# Patient Record
Sex: Female | Born: 1940 | Race: White | Hispanic: No | Marital: Married | Smoking: Former smoker
Health system: Southern US, Community
[De-identification: ages and names within clinical notes are randomized; demographics above are authoritative.]

## PROBLEM LIST (undated history)

## (undated) DIAGNOSIS — I480 Paroxysmal atrial fibrillation: Secondary | ICD-10-CM

## (undated) DIAGNOSIS — A419 Sepsis, unspecified organism: Secondary | ICD-10-CM

## (undated) DIAGNOSIS — E876 Hypokalemia: Secondary | ICD-10-CM

## (undated) DIAGNOSIS — R112 Nausea with vomiting, unspecified: Secondary | ICD-10-CM

## (undated) DIAGNOSIS — Z5189 Encounter for other specified aftercare: Secondary | ICD-10-CM

## (undated) DIAGNOSIS — K8051 Calculus of bile duct without cholangitis or cholecystitis with obstruction: Secondary | ICD-10-CM

## (undated) DIAGNOSIS — T8859XA Other complications of anesthesia, initial encounter: Secondary | ICD-10-CM

## (undated) DIAGNOSIS — E785 Hyperlipidemia, unspecified: Secondary | ICD-10-CM

## (undated) DIAGNOSIS — T7840XA Allergy, unspecified, initial encounter: Secondary | ICD-10-CM

## (undated) DIAGNOSIS — R7303 Prediabetes: Secondary | ICD-10-CM

## (undated) DIAGNOSIS — N390 Urinary tract infection, site not specified: Secondary | ICD-10-CM

## (undated) DIAGNOSIS — K219 Gastro-esophageal reflux disease without esophagitis: Secondary | ICD-10-CM

## (undated) DIAGNOSIS — M1990 Unspecified osteoarthritis, unspecified site: Secondary | ICD-10-CM

## (undated) DIAGNOSIS — F1910 Other psychoactive substance abuse, uncomplicated: Secondary | ICD-10-CM

## (undated) DIAGNOSIS — H269 Unspecified cataract: Secondary | ICD-10-CM

## (undated) DIAGNOSIS — F419 Anxiety disorder, unspecified: Secondary | ICD-10-CM

## (undated) DIAGNOSIS — I499 Cardiac arrhythmia, unspecified: Secondary | ICD-10-CM

## (undated) DIAGNOSIS — K859 Acute pancreatitis without necrosis or infection, unspecified: Secondary | ICD-10-CM

## (undated) DIAGNOSIS — I10 Essential (primary) hypertension: Secondary | ICD-10-CM

## (undated) HISTORY — PX: APPENDECTOMY: SHX54

## (undated) HISTORY — DX: Encounter for other specified aftercare: Z51.89

## (undated) HISTORY — PX: ABDOMINAL HYSTERECTOMY: SHX81

## (undated) HISTORY — DX: Unspecified osteoarthritis, unspecified site: M19.90

## (undated) HISTORY — PX: EXPLORATORY LAPAROTOMY: SUR591

## (undated) HISTORY — DX: Allergy, unspecified, initial encounter: T78.40XA

## (undated) HISTORY — DX: Unspecified cataract: H26.9

## (undated) HISTORY — DX: Prediabetes: R73.03

## (undated) HISTORY — DX: Nausea with vomiting, unspecified: R11.2

## (undated) HISTORY — DX: Sepsis, unspecified organism: A41.9

## (undated) HISTORY — DX: Hypokalemia: E87.6

## (undated) HISTORY — DX: Hyperlipidemia, unspecified: E78.5

## (undated) HISTORY — PX: PARTIAL HYSTERECTOMY: SHX80

## (undated) HISTORY — DX: Anxiety disorder, unspecified: F41.9

## (undated) HISTORY — PX: JOINT REPLACEMENT: SHX530

## (undated) HISTORY — DX: Other psychoactive substance abuse, uncomplicated: F19.10

## (undated) HISTORY — DX: Essential (primary) hypertension: I10

## (undated) HISTORY — DX: Urinary tract infection, site not specified: N39.0

## (undated) HISTORY — DX: Calculus of bile duct without cholangitis or cholecystitis with obstruction: K80.51

---

## 2005-12-04 ENCOUNTER — Ambulatory Visit: Payer: Self-pay | Admitting: Infectious Diseases

## 2007-03-17 ENCOUNTER — Ambulatory Visit: Payer: Self-pay | Admitting: Specialist

## 2007-04-07 ENCOUNTER — Ambulatory Visit: Payer: Self-pay | Admitting: Orthopedic Surgery

## 2007-04-12 ENCOUNTER — Ambulatory Visit: Payer: Self-pay | Admitting: Orthopedic Surgery

## 2007-12-18 IMAGING — NM NUCLEAR MEDICINE WHOLE BODY BONE SCINTIGRAPHY
2 series · 9 of 9 positions shown · non-contrast
Comparison: none

REASON FOR EXAM: Righ Thigh  pain
COMMENTS:

[Series 1: statics · 2.40mm/px · 4 acquisitions, 7 frames shown]
[im 1/4]
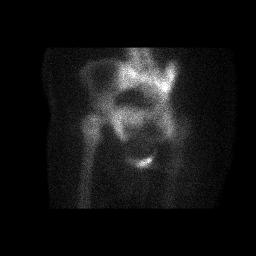
[im 2/4]
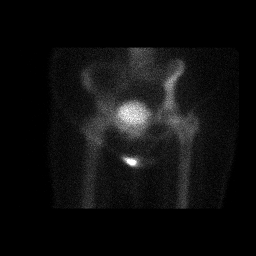
[im 2/4]
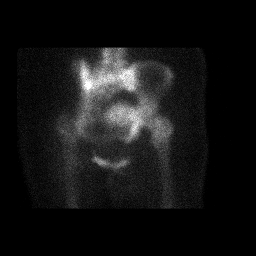
[im 3/4]
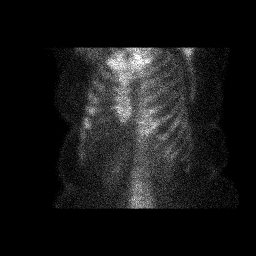
[im 3/4]
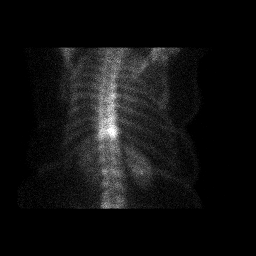
[im 4/4]
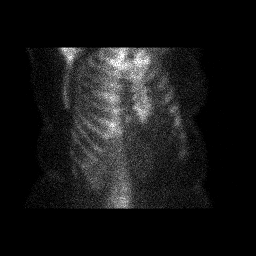
[im 4/4]
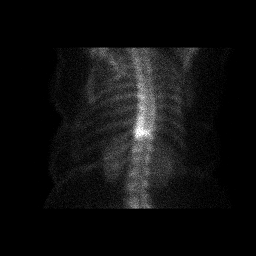

[Series 1: 3 hr wholebody · 2.40mm/px · 2 of 2 frames shown]
[frame 1/2]
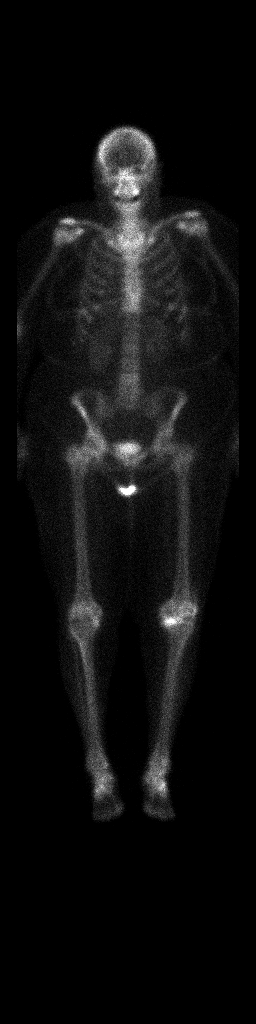
[frame 2/2]
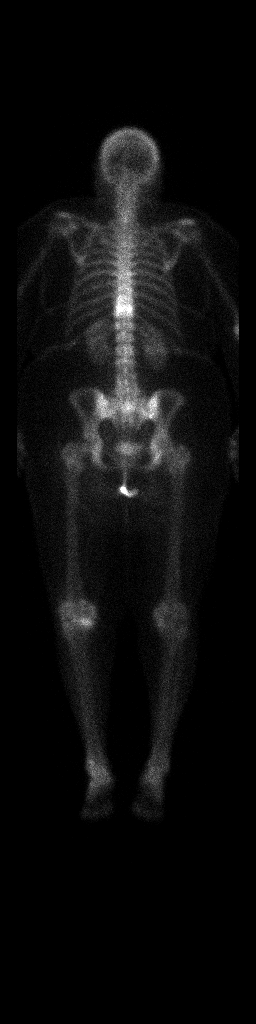

[9 of 9 positions shown; findings below may reference images not displayed]

PROCEDURE:     NM  - NM BONE WB 3 HR [DATE]  [DATE]

RESULT:     The patient received an injection of 22.78 mCi of Tc 99m labeled
MDP for the study. Anterior and posterior whole-body images were obtained in
addition to oblique views over the pelvis and thoracic region. There is
increased localization medially in the LEFT knee which is fairly impressive.
There is also some minimal increased activity in the medial aspect of the
RIGHT knee as well as in the thoracic spine at the T11 level. There is
slightly asymmetric increased localization within the RIGHT sacroiliac joint
on the posterior view. No definite increased localization is seen in either
femur. There is increased localization diffusely in both feet and ankle
regions as well as in the shoulders.
IMPRESSION: 1. Abnormal whole-body bone scan. The uptake in the medial knee compartments
may be secondary to degenerative change. An occult tibial plateau fracture
on the LEFT could not be completely excluded. Plain film correlation is
recommended. There is also localization in the thoracic spine which may be
secondary to a compression fracture.
2. Probable degenerative changes in the shoulders and ankles as well as in
the sternoclavicular joints.

## 2008-02-20 ENCOUNTER — Ambulatory Visit: Payer: Self-pay | Admitting: Orthopedic Surgery

## 2008-03-14 ENCOUNTER — Inpatient Hospital Stay: Payer: Self-pay | Admitting: Orthopedic Surgery

## 2009-07-08 ENCOUNTER — Ambulatory Visit: Payer: Self-pay | Admitting: Orthopedic Surgery

## 2009-07-17 ENCOUNTER — Inpatient Hospital Stay: Payer: Self-pay | Admitting: Orthopedic Surgery

## 2010-11-09 HISTORY — PX: PARATHYROID EXPLORATION: SHX732

## 2011-05-27 ENCOUNTER — Ambulatory Visit: Payer: Self-pay | Admitting: Otolaryngology

## 2011-06-10 ENCOUNTER — Ambulatory Visit: Payer: Self-pay | Admitting: Otolaryngology

## 2011-06-10 HISTORY — PX: PARATHYROIDECTOMY: SHX19

## 2011-06-11 LAB — PATHOLOGY REPORT

## 2014-09-28 ENCOUNTER — Ambulatory Visit: Payer: Self-pay | Admitting: Endocrinology

## 2015-07-04 ENCOUNTER — Encounter: Payer: Self-pay | Admitting: *Deleted

## 2015-07-11 ENCOUNTER — Ambulatory Visit (INDEPENDENT_AMBULATORY_CARE_PROVIDER_SITE_OTHER): Payer: Medicare HMO | Admitting: General Surgery

## 2015-07-11 ENCOUNTER — Encounter: Payer: Self-pay | Admitting: General Surgery

## 2015-07-11 VITALS — BP 142/82 | HR 82 | Resp 16 | Ht 62.0 in | Wt 207.0 lb

## 2015-07-11 DIAGNOSIS — K8064 Calculus of gallbladder and bile duct with chronic cholecystitis without obstruction: Secondary | ICD-10-CM | POA: Diagnosis not present

## 2015-07-11 NOTE — Patient Instructions (Addendum)
The patient is aware to call back for any questions or concerns.  Laparoscopic Cholecystectomy Laparoscopic cholecystectomy is surgery to remove the gallbladder. The gallbladder is located in the upper right part of the abdomen, behind the liver. It is a storage sac for bile produced in the liver. Bile aids in the digestion and absorption of fats. Cholecystectomy is often done for inflammation of the gallbladder (cholecystitis). This condition is usually caused by a buildup of gallstones (cholelithiasis) in your gallbladder. Gallstones can block the flow of bile, resulting in inflammation and pain. In severe cases, emergency surgery may be required. When emergency surgery is not required, you will have time to prepare for the procedure. Laparoscopic surgery is an alternative to open surgery. Laparoscopic surgery has a shorter recovery time. Your common bile duct may also need to be examined during the procedure. If stones are found in the common bile duct, they may be removed. LET [facility] CARE PROVIDER KNOW ABOUT:  Any allergies you have.  All medicines you are taking, including vitamins, herbs, eye drops, creams, and over-the-counter medicines.  Previous problems you or members of your family have had with the use of anesthetics.  Any blood disorders you have.  Previous surgeries you have had.  Medical conditions you have. RISKS AND COMPLICATIONS Generally, this is a safe procedure. However, as with any procedure, complications can occur. Possible complications include:  Infection.  Damage to the common bile duct, nerves, arteries, veins, or other internal organs such as the stomach, liver, or intestines.  Bleeding.  A stone may remain in the common bile duct.  A bile leak from the cyst duct that is clipped when your gallbladder is removed.  The need to convert to open surgery, which requires a larger incision in the abdomen. This may be necessary if your surgeon thinks it is not  safe to continue with a laparoscopic procedure. BEFORE THE PROCEDURE  Ask your health care provider about changing or stopping any regular medicines. You will need to stop taking aspirin or blood thinners at least 5 days prior to surgery.  Do not eat or drink anything after midnight the night before surgery.  Let your health care provider know if you develop a cold or other infectious problem before surgery. PROCEDURE   You will be given medicine to make you sleep through the procedure (general anesthetic). A breathing tube will be placed in your mouth.  When you are asleep, your surgeon will make several small cuts (incisions) in your abdomen.  A thin, lighted tube with a tiny camera on the end (laparoscope) is inserted through one of the small incisions. The camera on the laparoscope sends a picture to a TV screen in the operating room. This gives the surgeon a good view inside your abdomen.  A gas will be pumped into your abdomen. This expands your abdomen so that the surgeon has more room to perform the surgery.  Other tools needed for the procedure are inserted through the other incisions. The gallbladder is removed through one of the incisions.  After the removal of your gallbladder, the incisions will be closed with stitches, staples, or skin glue. AFTER THE PROCEDURE  You will be taken to a recovery area where your progress will be checked often.  You may be allowed to go home the same day if your pain is controlled and you can tolerate liquids. Document Released: 10/26/2005 Document Revised: 08/16/2013 Document Reviewed: 06/07/2013 [employer] Patient Information 2015 [place], [place]. This information is not  intended to replace advice given to you by your health care provider. Make sure you discuss any questions you have with your health care provider.

## 2015-07-11 NOTE — Progress Notes (Signed)
Patient ID: Charlene Norris, female   DOB: 02/28/1941, 74 y.o.   MRN: [id number]  Chief Complaint  Patient presents with  . Abdominal Pain    HPI Charlene Norris is a 74 y.o. female.  Here today for evaluation of her gallbladder. She states she several years she has had nausea, no vomiting. But over the past several months it seems to be worse with occasional right abdominal pain and upper stomach. The pain comes and goes, feels like a "knife". Poor appetite. Can't tolerate greasy and fatty foods. CT scan was 07-03-15. She states she just "feels bad". She has als been short of breath with minimal activity for last4-6 weeks.She has not mentioned this to Dr. Patrecia Pace.  HPI  Past Medical History  Diagnosis Date  . Cataract   . Arthritis   . Hypertension   . Hyperlipidemia   . Pre-diabetes     Past Surgical History  Procedure Laterality Date  . Partial hysterectomy    . Joint replacement Bilateral 2009, 2010    knee  . Parathyroid exploration  2012    growth/ Dr Willeen Cass  . Exploratory laparotomy      Family History  Problem Relation Age of Onset  . Diabetes Father   . Hypertension Father   . Parkinson's disease Mother     Social History Social History  Substance Use Topics  . Smoking status: Former Smoker -- 5 years    Quit date: 11/09/1968  . Smokeless tobacco: Never Used  . Alcohol Use: No    Allergies  Allergen Reactions  . Allopurinol Shortness Of Breath  . Levofloxacin Shortness Of Breath    Chest pressure  . Ampicillin Itching  . Celebrex [Celecoxib] Hives  . Nexium [Esomeprazole Magnesium] Hives  . Zyrtec [Cetirizine] Other (See Comments)    Feel ill    Current Outpatient Prescriptions  Medication Sig Dispense Refill  . amoxicillin (AMOXIL) 500 MG capsule Take 500 mg by mouth as needed. procedures    . aspirin 325 MG tablet Take 325 mg by mouth daily.    . clidinium-chlordiazePOXIDE (LIBRAX) 5-2.5 MG per capsule TK 1 C PO QD PRN  1  .  glucosamine-chondroitin 500-400 MG tablet Take 1 tablet by mouth 2 (two) times daily.    Marland Kitchen lisinopril (PRINIVIL,ZESTRIL) 10 MG tablet TK 1 T PO D  2  . lovastatin (MEVACOR) 40 MG tablet TK 1 T PO QAM  2  . Omega-3 Fatty Acids (FISH OIL) 1200 MG CAPS Take by mouth daily.    . propranolol-hydrochlorothiazide (INDERIDE) 40-25 MG per tablet TK 1 T PO QAM  2  . ranitidine (ZANTAC) 150 MG tablet TK 1 T PO BID  0  . ULORIC 40 MG tablet TK 1 T PO ONCE D  2  . vitamin B-12 (CYANOCOBALAMIN) 1000 MCG tablet Take 1,000 mcg by mouth daily.    . Vitamin D, Ergocalciferol, (DRISDOL) 50000 UNITS CAPS capsule TK 1 C PO TWO TIMES A WEEK  2  . vitamin E 400 UNIT capsule Take 400 Units by mouth daily.     No current facility-administered medications for this visit.    Review of Systems Review of Systems  Constitutional: Positive for fatigue.  Respiratory: Positive for shortness of breath.   Cardiovascular: Negative.   Gastrointestinal: Positive for nausea and abdominal pain. Negative for vomiting, diarrhea and constipation.    Blood pressure 142/82, pulse 82, resp. rate 16, height  (1.575 m), weight 207 lb (93.895 kg).  Physical Exam  Physical Exam  Constitutional: She is oriented to person, place, and time. She appears well-developed and well-nourished.  HENT:  Mouth/Throat: Oropharynx is clear and moist.  Eyes: Conjunctivae are normal. No scleral icterus.  Neck: Neck supple.  Cardiovascular: Normal rate, regular rhythm and normal heart sounds.   Pulmonary/Chest: Effort normal and breath sounds normal.  Abdominal: Soft. Normal appearance and bowel sounds are normal. There is tenderness in the right upper quadrant.  Mild tenderness RUQ.  Lymphadenopathy:    She has no cervical adenopathy.  Neurological: She is alert and oriented to person, place, and time.  Skin: Skin is warm and dry.  Psychiatric: Her behavior is normal.    Data Reviewed Progress notes and recent CT scan, [place]. gallstones  and thickened GBW  Assessment    Cholelithiasis with chronic cholecystitis.    Plan   Recommended laparoscopic cholecystectomy. Laparoscopic Cholecystectomy with Intraoperative Cholangiogram. The procedure, including it's potential risks and complications (including but not limited to infection, bleeding, injury to intra-abdominal organs or bile ducts, bile leak, poor cosmetic result, sepsis and death) were discussed with the patient in detail. Non-operative options, including their inherent risks (acute calculous cholecystitis with possible choledocholithiasis or gallstone pancreatitis, with the risk of ascending cholangitis, sepsis, and death) were discussed as well. The patient expressed and understanding of what we discussed and wishes to proceed with laparoscopic cholecystectomy. The patient further understands that if it is technically not possible, or it is unsafe to proceed laparoscopically, that I will convert to an open cholecystectomy. Will need shortness of breath evaluation by Dr. Patrecia Pace prior to surgery. Patient has been scheduled for an appointment on 07-12-15 at 10 am.     Patient's surgery to be arranged once clearance is received from Dr. Patrecia Pace.      PCP:  Mina Marble 07/11/2015, 10:11 AM

## 2015-07-17 ENCOUNTER — Telehealth: Payer: Self-pay | Admitting: *Deleted

## 2015-07-17 NOTE — Telephone Encounter (Signed)
Patient wanted to know if you have received and review the medical clearance from Dr. Randell Loop office, so she can planned on getting her surgery scheduled.

## 2015-07-18 ENCOUNTER — Other Ambulatory Visit: Payer: Self-pay | Admitting: General Surgery

## 2015-07-18 DIAGNOSIS — K8010 Calculus of gallbladder with chronic cholecystitis without obstruction: Secondary | ICD-10-CM

## 2015-07-18 NOTE — Telephone Encounter (Signed)
We have received surgical clearance from Dr. Gregery Na office.   Patient's surgery has been scheduled for 07-24-15 at [facility]. This patient has been asked to decrease current 325 mg aspirin to 81 mg aspirin starting today.

## 2015-07-19 ENCOUNTER — Other Ambulatory Visit: Payer: Self-pay

## 2015-07-19 ENCOUNTER — Encounter: Payer: Self-pay | Admitting: *Deleted

## 2015-07-19 NOTE — Patient Instructions (Signed)
  Your procedure is scheduled on: 07-24-15 Report to [facility] SAME [facility] 2ND FLOOR To find out your arrival time please call [phone] between 1PM - 3PM on 07-23-15  Remember: Instructions that are not followed completely may result in serious medical risk, up to and including death, or upon the discretion of your surgeon and anesthesiologist your surgery may need to be rescheduled.    _X___ 1. Do not eat food or drink liquids after midnight. No gum chewing or hard candies.     _X___ 2. No Alcohol for 24 hours before or after surgery.   ____ 3. Bring all medications with you on the day of surgery if instructed.    ____ 4. Notify your doctor if there is any change in your medical condition     (cold, fever, infections).     Do not wear jewelry, make-up, hairpins, clips or nail polish.  Do not wear lotions, powders, or perfumes. You may wear deodorant.  Do not shave 48 hours prior to surgery. Men may shave face and neck.  Do not bring valuables to the hospital.    [facility] is not responsible for any belongings or valuables.               Contacts, dentures or bridgework may not be worn into surgery.  Leave your suitcase in the car. After surgery it may be brought to your room.  For patients admitted to the hospital, discharge time is determined by your   treatment team.   Patients discharged the day of surgery will not be allowed to drive home.   Please read over the following fact sheets that you were given:     __X__ Take these medicines the morning of surgery with A SIP OF WATER:    1. PEPCID  2. TAKE A PEPCID Tuesday NIGHT  3.   4.  5.  6.  ____ Fleet Enema (as directed)   ____ Use CHG Soap as directed  ____ Use inhalers on the day of surgery  ____ Stop metformin 2 days prior to surgery    ____ Take 1/2 of usual insulin dose the night before surgery and none on the morning of surgery.   ____ Stop Coumadin/Plavix/aspirin-PT CAN CONTINUE 81 MG ASA PER  PT PER DR SANKAR-DO NOT TAKE DAY OF SURGERY  ____ Stop Anti-inflammatories-NO NSAIDS OR ASA PRODUCTS-TYLENOL OK   __X__ Stop supplements until after surgery-STOP VIT E, FISH OIL AND GLUCOSAMINE-CHONDROITIN NOW  ____ Bring C-Pap to the hospital.

## 2015-07-23 MED ORDER — NA CHONDROIT SULF-NA HYALURON 40-17 MG/ML IO SOLN
INTRAOCULAR | Status: AC
  Filled 2015-07-23: qty 1

## 2015-07-23 MED ORDER — CEFUROXIME OPHTHALMIC INJECTION 1 MG/0.1 ML
INJECTION | OPHTHALMIC | Status: AC
  Filled 2015-07-23: qty 0.1

## 2015-07-23 MED ORDER — EPINEPHRINE HCL 1 MG/ML IJ SOLN
INTRAMUSCULAR | Status: AC
  Filled 2015-07-23: qty 1

## 2015-07-24 ENCOUNTER — Ambulatory Visit: Payer: Medicare HMO

## 2015-07-24 ENCOUNTER — Observation Stay
Admission: RE | Admit: 2015-07-24 | Discharge: 2015-07-27 | Disposition: A | Discharge status: Home / Self Care | Payer: Medicare HMO | Source: Ambulatory Visit | Attending: General Surgery | Admitting: General Surgery

## 2015-07-24 ENCOUNTER — Ambulatory Visit: Payer: Medicare HMO | Admitting: Anesthesiology

## 2015-07-24 ENCOUNTER — Encounter
Admission: RE | Disposition: A | Discharge status: Home / Self Care | Payer: Self-pay | Source: Ambulatory Visit | Attending: General Surgery

## 2015-07-24 DIAGNOSIS — I10 Essential (primary) hypertension: Secondary | ICD-10-CM | POA: Diagnosis not present

## 2015-07-24 DIAGNOSIS — Z87891 Personal history of nicotine dependence: Secondary | ICD-10-CM | POA: Insufficient documentation

## 2015-07-24 DIAGNOSIS — M1990 Unspecified osteoarthritis, unspecified site: Secondary | ICD-10-CM | POA: Diagnosis not present

## 2015-07-24 DIAGNOSIS — Z888 Allergy status to other drugs, medicaments and biological substances status: Secondary | ICD-10-CM | POA: Insufficient documentation

## 2015-07-24 DIAGNOSIS — K8010 Calculus of gallbladder with chronic cholecystitis without obstruction: Secondary | ICD-10-CM

## 2015-07-24 DIAGNOSIS — Z96653 Presence of artificial knee joint, bilateral: Secondary | ICD-10-CM | POA: Diagnosis not present

## 2015-07-24 DIAGNOSIS — Z7982 Long term (current) use of aspirin: Secondary | ICD-10-CM | POA: Insufficient documentation

## 2015-07-24 DIAGNOSIS — Z79899 Other long term (current) drug therapy: Secondary | ICD-10-CM | POA: Insufficient documentation

## 2015-07-24 DIAGNOSIS — Z8249 Family history of ischemic heart disease and other diseases of the circulatory system: Secondary | ICD-10-CM | POA: Diagnosis not present

## 2015-07-24 DIAGNOSIS — K8020 Calculus of gallbladder without cholecystitis without obstruction: Secondary | ICD-10-CM | POA: Diagnosis not present

## 2015-07-24 DIAGNOSIS — Z820 Family history of epilepsy and other diseases of the nervous system: Secondary | ICD-10-CM | POA: Insufficient documentation

## 2015-07-24 DIAGNOSIS — K8000 Calculus of gallbladder with acute cholecystitis without obstruction: Principal | ICD-10-CM | POA: Insufficient documentation

## 2015-07-24 DIAGNOSIS — E785 Hyperlipidemia, unspecified: Secondary | ICD-10-CM | POA: Diagnosis not present

## 2015-07-24 DIAGNOSIS — Z1231 Encounter for screening mammogram for malignant neoplasm of breast: Secondary | ICD-10-CM

## 2015-07-24 DIAGNOSIS — Z881 Allergy status to other antibiotic agents status: Secondary | ICD-10-CM | POA: Diagnosis not present

## 2015-07-24 DIAGNOSIS — Z833 Family history of diabetes mellitus: Secondary | ICD-10-CM | POA: Insufficient documentation

## 2015-07-24 DIAGNOSIS — Z90710 Acquired absence of both cervix and uterus: Secondary | ICD-10-CM | POA: Diagnosis not present

## 2015-07-24 HISTORY — DX: Cardiac arrhythmia, unspecified: I49.9

## 2015-07-24 HISTORY — PX: CHOLECYSTECTOMY: SHX55

## 2015-07-24 HISTORY — DX: Calculus of gallbladder with chronic cholecystitis without obstruction: K80.10

## 2015-07-24 HISTORY — DX: Gastro-esophageal reflux disease without esophagitis: K21.9

## 2015-07-24 SURGERY — LAPAROSCOPIC CHOLECYSTECTOMY
Anesthesia: General

## 2015-07-24 MED ORDER — OXYCODONE HCL 5 MG PO TABS
5.0000 mg | ORAL_TABLET | ORAL | Status: DC | PRN
  Administered 2015-07-25: 10 mg via ORAL
  Administered 2015-07-25 – 2015-07-27 (×6): 5 mg via ORAL
  Filled 2015-07-24 (×3): qty 1
  Filled 2015-07-24 (×2): qty 2
  Filled 2015-07-24: qty 1
  Filled 2015-07-24: qty 2

## 2015-07-24 MED ORDER — ACETAMINOPHEN 650 MG RE SUPP: 650.0000 mg | Freq: Four times a day (QID) | RECTAL | Status: DC | PRN

## 2015-07-24 MED ORDER — VITAMIN D (ERGOCALCIFEROL) 1.25 MG (50000 UNIT) PO CAPS
50000.0000 [IU] | ORAL_CAPSULE | ORAL | Status: DC
  Administered 2015-07-24: 50000 [IU] via ORAL
  Filled 2015-07-24: qty 1

## 2015-07-24 MED ORDER — HYDROMORPHONE HCL 1 MG/ML IJ SOLN
INTRAMUSCULAR | Status: AC
  Filled 2015-07-24: qty 1

## 2015-07-24 MED ORDER — CHLORHEXIDINE GLUCONATE 4 % EX LIQD: 1.0000 "application " | Freq: Once | CUTANEOUS | Status: DC

## 2015-07-24 MED ORDER — ROCURONIUM BROMIDE 100 MG/10ML IV SOLN
INTRAVENOUS | Status: DC | PRN
  Administered 2015-07-24 (×2): 10 mg via INTRAVENOUS
  Administered 2015-07-24: 30 mg via INTRAVENOUS

## 2015-07-24 MED ORDER — MORPHINE SULFATE (PF) 2 MG/ML IV SOLN
2.0000 mg | INTRAVENOUS | Status: DC | PRN
  Administered 2015-07-24 – 2015-07-25 (×6): 2 mg via INTRAVENOUS
  Filled 2015-07-24 (×6): qty 1

## 2015-07-24 MED ORDER — OMEGA-3-ACID ETHYL ESTERS 1 G PO CAPS
1.0000 g | ORAL_CAPSULE | Freq: Every day | ORAL | Status: DC
  Administered 2015-07-24 – 2015-07-26 (×2): 1 g via ORAL
  Filled 2015-07-24 (×4): qty 1

## 2015-07-24 MED ORDER — SODIUM CHLORIDE 0.9 % IJ SOLN
INTRAMUSCULAR | Status: AC
  Filled 2015-07-24: qty 50

## 2015-07-24 MED ORDER — LACTATED RINGERS IV SOLN
INTRAVENOUS | Status: DC
  Administered 2015-07-24 (×2): via INTRAVENOUS

## 2015-07-24 MED ORDER — FAMOTIDINE 20 MG PO TABS
10.0000 mg | ORAL_TABLET | ORAL | Status: DC | PRN
  Administered 2015-07-25: 10 mg via ORAL
  Filled 2015-07-24: qty 1

## 2015-07-24 MED ORDER — VITAMIN B-12 1000 MCG PO TABS
1000.0000 ug | ORAL_TABLET | Freq: Every day | ORAL | Status: DC
Start: 2015-07-24 — End: 2015-07-27
  Administered 2015-07-24 – 2015-07-26 (×2): 1000 ug via ORAL
  Filled 2015-07-24 (×4): qty 1

## 2015-07-24 MED ORDER — VITAMIN E 180 MG (400 UNIT) PO CAPS
400.0000 [IU] | ORAL_CAPSULE | Freq: Every day | ORAL | Status: DC
  Administered 2015-07-24 – 2015-07-26 (×2): 400 [IU] via ORAL
  Filled 2015-07-24 (×4): qty 1

## 2015-07-24 MED ORDER — MIDAZOLAM HCL 2 MG/2ML IJ SOLN
INTRAMUSCULAR | Status: DC | PRN
  Administered 2015-07-24: 2 mg via INTRAVENOUS

## 2015-07-24 MED ORDER — CILIDINIUM-CHLORDIAZEPOXIDE 2.5-5 MG PO CAPS
1.0000 | ORAL_CAPSULE | Freq: Every day | ORAL | Status: DC
  Filled 2015-07-24 (×3): qty 1

## 2015-07-24 MED ORDER — FEBUXOSTAT 40 MG PO TABS
40.0000 mg | ORAL_TABLET | Freq: Every day | ORAL | Status: DC
  Administered 2015-07-24 – 2015-07-26 (×3): 40 mg via ORAL
  Filled 2015-07-24 (×4): qty 1

## 2015-07-24 MED ORDER — LISINOPRIL 5 MG PO TABS
5.0000 mg | ORAL_TABLET | Freq: Every day | ORAL | Status: DC
Start: 2015-07-24 — End: 2015-07-26
  Administered 2015-07-24: 5 mg via ORAL
  Filled 2015-07-24 (×2): qty 1

## 2015-07-24 MED ORDER — HYDROMORPHONE HCL 1 MG/ML IJ SOLN
INTRAMUSCULAR | Status: AC
  Administered 2015-07-24: 0.5 mg
  Filled 2015-07-24: qty 1

## 2015-07-24 MED ORDER — PROPOFOL 10 MG/ML IV BOLUS
INTRAVENOUS | Status: DC | PRN
  Administered 2015-07-24: 160 mg via INTRAVENOUS

## 2015-07-24 MED ORDER — PHENYLEPHRINE HCL 10 MG/ML IJ SOLN
INTRAMUSCULAR | Status: DC | PRN
  Administered 2015-07-24 (×2): 200 ug via INTRAVENOUS

## 2015-07-24 MED ORDER — ONDANSETRON HCL 4 MG/2ML IJ SOLN
INTRAMUSCULAR | Status: DC | PRN
  Administered 2015-07-24: 4 mg via INTRAVENOUS

## 2015-07-24 MED ORDER — CEFAZOLIN SODIUM-DEXTROSE 2-3 GM-% IV SOLR
INTRAVENOUS | Status: AC
  Administered 2015-07-24: 2 g via INTRAVENOUS
  Filled 2015-07-24: qty 50

## 2015-07-24 MED ORDER — PRAVASTATIN SODIUM 10 MG PO TABS
10.0000 mg | ORAL_TABLET | Freq: Every day | ORAL | Status: DC
  Administered 2015-07-24 – 2015-07-26 (×3): 10 mg via ORAL
  Filled 2015-07-24 (×3): qty 1

## 2015-07-24 MED ORDER — MORPHINE SULFATE (PF) 2 MG/ML IV SOLN
2.0000 mg | INTRAVENOUS | Status: DC | PRN
  Administered 2015-07-24: 2 mg via INTRAVENOUS
  Filled 2015-07-24: qty 1

## 2015-07-24 MED ORDER — PROPRANOLOL HCL 10 MG PO TABS
40.0000 mg | ORAL_TABLET | Freq: Every day | ORAL | Status: DC
  Administered 2015-07-24 – 2015-07-26 (×2): 40 mg via ORAL
  Filled 2015-07-24 (×3): qty 4

## 2015-07-24 MED ORDER — DEXTROSE-NACL 5-0.45 % IV SOLN
INTRAVENOUS | Status: DC
  Administered 2015-07-24 – 2015-07-26 (×4): via INTRAVENOUS

## 2015-07-24 MED ORDER — ASPIRIN EC 81 MG PO TBEC
81.0000 mg | DELAYED_RELEASE_TABLET | Freq: Every day | ORAL | Status: DC
  Administered 2015-07-24 – 2015-07-26 (×3): 81 mg via ORAL
  Filled 2015-07-24 (×4): qty 1

## 2015-07-24 MED ORDER — SUGAMMADEX SODIUM 200 MG/2ML IV SOLN
INTRAVENOUS | Status: DC | PRN
  Administered 2015-07-24: 180 mg via INTRAVENOUS

## 2015-07-24 MED ORDER — FENTANYL CITRATE (PF) 100 MCG/2ML IJ SOLN
25.0000 ug | INTRAMUSCULAR | Status: DC | PRN
  Administered 2015-07-24 (×4): 25 ug via INTRAVENOUS

## 2015-07-24 MED ORDER — GLUCOSAMINE-CHONDROITIN 500-400 MG PO TABS: 1.0000 | ORAL_TABLET | Freq: Every day | ORAL | Status: DC

## 2015-07-24 MED ORDER — ONDANSETRON HCL 4 MG/2ML IJ SOLN
4.0000 mg | Freq: Once | INTRAMUSCULAR | Status: AC | PRN
  Administered 2015-07-24: 4 mg via INTRAVENOUS

## 2015-07-24 MED ORDER — CEFTRIAXONE SODIUM 2 G IJ SOLR
2.0000 g | INTRAMUSCULAR | Status: DC
  Administered 2015-07-24 – 2015-07-26 (×3): 2 g via INTRAVENOUS
  Filled 2015-07-24 (×5): qty 2

## 2015-07-24 MED ORDER — ACETAMINOPHEN 10 MG/ML IV SOLN
INTRAVENOUS | Status: DC | PRN
  Administered 2015-07-24: 1000 mg via INTRAVENOUS

## 2015-07-24 MED ORDER — ONDANSETRON HCL 4 MG/2ML IJ SOLN
4.0000 mg | Freq: Four times a day (QID) | INTRAMUSCULAR | Status: DC | PRN
  Administered 2015-07-24: 4 mg via INTRAVENOUS
  Filled 2015-07-24: qty 2

## 2015-07-24 MED ORDER — ONDANSETRON HCL 4 MG/2ML IJ SOLN
INTRAMUSCULAR | Status: AC
  Filled 2015-07-24: qty 2

## 2015-07-24 MED ORDER — PROPRANOLOL-HCTZ 40-25 MG PO TABS: 1.0000 | ORAL_TABLET | Freq: Every day | ORAL | Status: DC

## 2015-07-24 MED ORDER — ACETAMINOPHEN 325 MG PO TABS: 650.0000 mg | ORAL_TABLET | Freq: Four times a day (QID) | ORAL | Status: DC | PRN

## 2015-07-24 MED ORDER — GLYCOPYRROLATE 0.2 MG/ML IJ SOLN
INTRAMUSCULAR | Status: DC | PRN
  Administered 2015-07-24: 0.2 mg via INTRAVENOUS

## 2015-07-24 MED ORDER — ACETAMINOPHEN 10 MG/ML IV SOLN
INTRAVENOUS | Status: AC
  Filled 2015-07-24: qty 100

## 2015-07-24 MED ORDER — FENTANYL CITRATE (PF) 100 MCG/2ML IJ SOLN
INTRAMUSCULAR | Status: DC | PRN
  Administered 2015-07-24 (×2): 50 ug via INTRAVENOUS

## 2015-07-24 MED ORDER — HYDROMORPHONE HCL 1 MG/ML IJ SOLN
0.5000 mg | INTRAMUSCULAR | Status: DC | PRN
  Administered 2015-07-24 (×3): 0.5 mg via INTRAVENOUS

## 2015-07-24 MED ORDER — DEXAMETHASONE SODIUM PHOSPHATE 4 MG/ML IJ SOLN
INTRAMUSCULAR | Status: DC | PRN
  Administered 2015-07-24: 5 mg via INTRAVENOUS

## 2015-07-24 MED ORDER — CEFAZOLIN SODIUM-DEXTROSE 2-3 GM-% IV SOLR
2.0000 g | INTRAVENOUS | Status: AC
  Administered 2015-07-24: 2 g via INTRAVENOUS

## 2015-07-24 MED ORDER — PROMETHAZINE HCL 25 MG/ML IJ SOLN
12.5000 mg | INTRAMUSCULAR | Status: DC | PRN
  Administered 2015-07-24: 12.5 mg via INTRAVENOUS
  Filled 2015-07-24: qty 1

## 2015-07-24 MED ORDER — LIDOCAINE HCL (CARDIAC) 20 MG/ML IV SOLN
INTRAVENOUS | Status: DC | PRN
  Administered 2015-07-24: 100 mg via INTRAVENOUS

## 2015-07-24 MED ORDER — FENTANYL CITRATE (PF) 100 MCG/2ML IJ SOLN
INTRAMUSCULAR | Status: AC
  Administered 2015-07-24: 25 ug via INTRAVENOUS
  Filled 2015-07-24: qty 2

## 2015-07-24 MED ORDER — HYDROCHLOROTHIAZIDE 25 MG PO TABS
25.0000 mg | ORAL_TABLET | Freq: Every day | ORAL | Status: DC
  Administered 2015-07-24: 25 mg via ORAL
  Filled 2015-07-24 (×3): qty 1

## 2015-07-24 SURGICAL SUPPLY — 58 items
ANCHOR TIS RET SYS 235ML (MISCELLANEOUS) ×2 IMPLANT
APPLICATOR SURGIFLO (MISCELLANEOUS) IMPLANT
APPLIER CLIP LOGIC TI 5 (MISCELLANEOUS) ×2 IMPLANT
BLADE SURG 10 STRL SS SAFETY (BLADE) ×2 IMPLANT
BLADE SURG 11 STRL SS SAFETY (MISCELLANEOUS) ×2 IMPLANT
BULB RESERV EVAC DRAIN JP 100C (MISCELLANEOUS) ×2 IMPLANT
CANISTER SUCT 1200ML W/VALVE (MISCELLANEOUS) ×2 IMPLANT
CANNULA DILATOR 10 W/SLV (CANNULA) ×4 IMPLANT
CATH CHOLANG 76X19 KUMAR (CATHETERS) ×2 IMPLANT
CHLORAPREP W/TINT 26ML (MISCELLANEOUS) ×2 IMPLANT
CLEANER CAUTERY TIP 5X5 PAD (MISCELLANEOUS) ×1 IMPLANT
DEFOGGER SCOPE WARMER CLEARIFY (MISCELLANEOUS) ×2 IMPLANT
DRAIN CHANNEL JP 19F (MISCELLANEOUS) ×2 IMPLANT
DRAPE C-ARM XRAY 36X54 (DRAPES) ×2 IMPLANT
DRAPE INCISE IOBAN 66X45 STRL (DRAPES) ×2 IMPLANT
DRESSING TELFA 4X3 1S ST N-ADH (GAUZE/BANDAGES/DRESSINGS) ×2 IMPLANT
DRSG OPSITE POSTOP 4X12 (GAUZE/BANDAGES/DRESSINGS) ×2 IMPLANT
DRSG TEGADERM 2-3/8X2-3/4 SM (GAUZE/BANDAGES/DRESSINGS) ×8 IMPLANT
ELECT BLADE 6 FLAT ULTRCLN (ELECTRODE) ×2 IMPLANT
GLOVE BIO SURGEON STRL SZ7 (GLOVE) ×2 IMPLANT
GOWN STRL REUS W/ TWL LRG LVL3 (GOWN DISPOSABLE) ×3 IMPLANT
GOWN STRL REUS W/TWL LRG LVL3 (GOWN DISPOSABLE) ×3
GRASPER SUT TROCAR 14GX15 (MISCELLANEOUS) ×2 IMPLANT
HANDLE YANKAUER SUCT BULB TIP (MISCELLANEOUS) ×2 IMPLANT
HEMOSTAT SURGICEL 2X3 (HEMOSTASIS) IMPLANT
IRRIGATION STRYKERFLOW (MISCELLANEOUS) ×1 IMPLANT
IRRIGATOR STRYKERFLOW (MISCELLANEOUS) ×2
IV LACTATED RINGERS 1000ML (IV SOLUTION) ×2 IMPLANT
KIT RM TURNOVER STRD PROC AR (KITS) ×2 IMPLANT
LABEL OR SOLS (LABEL) ×2 IMPLANT
NDL INSUFF ACCESS 14 VERSASTEP (NEEDLE) ×2 IMPLANT
PACK LAP CHOLECYSTECTOMY (MISCELLANEOUS) ×2 IMPLANT
PAD CLEANER CAUTERY TIP 5X5 (MISCELLANEOUS) ×1
PAD GROUND ADULT SPLIT (MISCELLANEOUS) ×2 IMPLANT
PENCIL ELECTRO HAND CTR (MISCELLANEOUS) ×2 IMPLANT
SCISSORS METZENBAUM CVD 33 (INSTRUMENTS) ×2 IMPLANT
SLEEVE ENDOPATH XCEL 5M (ENDOMECHANICALS) ×4 IMPLANT
SPOGE SURGIFLO 8M (HEMOSTASIS)
SPONGE KITTNER 5P (MISCELLANEOUS) ×2 IMPLANT
SPONGE LAP 18X18 5 PK (GAUZE/BANDAGES/DRESSINGS) ×4 IMPLANT
SPONGE SURGIFLO 8M (HEMOSTASIS) IMPLANT
STAPLER SKIN PROX 35W (STAPLE) ×2 IMPLANT
STRIP CLOSURE SKIN 1/2X4 (GAUZE/BANDAGES/DRESSINGS) ×2 IMPLANT
SUT PROLENE 0 CT 1 30 (SUTURE) ×4 IMPLANT
SUT SILK 2 0 (SUTURE) ×1
SUT SILK 2-0 30XBRD TIE 12 (SUTURE) ×1 IMPLANT
SUT VIC AB 0 CT1 36 (SUTURE) ×8 IMPLANT
SUT VIC AB 0 SH 27 (SUTURE) ×2 IMPLANT
SUT VIC AB 2-0 CT1 27 (SUTURE) ×1
SUT VIC AB 2-0 CT1 TAPERPNT 27 (SUTURE) ×1 IMPLANT
SUT VIC AB 2-0 SH 27 (SUTURE) ×3
SUT VIC AB 2-0 SH 27XBRD (SUTURE) ×3 IMPLANT
SUT VIC AB 4-0 FS2 27 (SUTURE) ×2 IMPLANT
SWAB DUAL CULTURE TRANS RED ST (MISCELLANEOUS) ×2 IMPLANT
SWABSTK COMLB BENZOIN TINCTURE (MISCELLANEOUS) ×2 IMPLANT
SYR BULB IRRIG 60ML STRL (SYRINGE) ×2 IMPLANT
TROCAR XCEL NON-BLD 5MMX100MML (ENDOMECHANICALS) ×2 IMPLANT
TUBING INSUFFLATOR HI FLOW (MISCELLANEOUS) ×2 IMPLANT

## 2015-07-24 NOTE — Op Note (Signed)
Preop diagnosis chronic cholecystitis and cholelithiasis  Post op diagnosis: Same with significant associated adhesion  Operation: Laparoscopy conversion to an open cholecystectomy  Surgeon: S.G.Sankar  Assistant: Westley Gambles Anesthesia: general Complications: none  EBL: 50-81mkl  Drains:Blake drain  Description: Patient was put to sleep in supine position the operating table. Abdomen was prepped and draped as sterile field. Timeout was performed. Port site incision was made at near the inferior lip of the umbilicus and the Veress needle was positioned in the peritoneal cavity verified of the hanging drop method. Pneumoperitoneum was obtained and 10 mm port was placed. Camera was introduced with good visualization. The gallbladder area was noted to be significantly altered. Epigastric and 2 lateral 5 mm ports were placed. Attempt was made to identify the gallbladder and free up the adhesions which was unsuccessful because of significant distortion of the anatomy. In the gallbladder area of feeling extremely hard in consistency. It was felt unsafe to proceed further with the laparoscopic technique. A standard right subcostal incision was made and deepened through the layers into the peritoneal cavity and bleeding was controlled with cautery. With careful exposure the gallbladder was further dissected. It was noted there was extremely hard and in contained a small pocket of pus near the fundus of the gallbladder and width and this was cultured. A single large stone identified about 3 cm in size. It was extremely close to the duodenum and pylorus but it did not appear there was any connection to this these 2 structures near the distal portion near the midportion of the gallbladder near the cystic duct it was hard to separate any of the structures. At this point it was decided to amputate the gallbladder high up on the cystic duct region and the entire remnant was suture ligated with 2-0 Vicryl .  Gallbladder was dissected off the bed using mostly blunt dissection in the raw surfaces were cauterized. Copious irrigation was utilized blood loss was controlled to a minimal amount of tube 50-75 mL. A Blake drain was positioned and brought out through one stab incisions laterally from the prior port. This was anchored to the skin with a nylon stitch. After all the instruments and packings were removed and counts reported correct the posterior sheath and peritoneum was closed with a running 0 Vicryl. The anterior sheath closed with interrupted figure-of-eight stitches of 0 proline. Subcutaneous tissue was irrigated and closed with 2-0 Vicryl. Skin and the remaining port side in the umbilicus closed with staples. Dry sterile dressings were placed patient tolerated the procedure well.

## 2015-07-24 NOTE — Anesthesia Procedure Notes (Addendum)
Procedure Name: Intubation Date/Time: 07/24/2015 12:49 PM Performed by: Michaele Offer Pre-anesthesia Checklist: Patient identified, Emergency Drugs available, Suction available, Patient being monitored and Timeout performed Patient Re-evaluated:Patient Re-evaluated prior to inductionOxygen Delivery Method: Circle system utilized Preoxygenation: Pre-oxygenation with 100% oxygen Intubation Type: IV induction Ventilation: Mask ventilation without difficulty Laryngoscope Size: Mac and 3 Grade View: Grade I Tube type: Oral Tube size: 7.0 mm Number of attempts: 1 Airway Equipment and Method: Rigid stylet Placement Confirmation: ETT inserted through vocal cords under direct vision,  positive ETCO2 and breath sounds checked- equal and bilateral Secured at: 20 cm Tube secured with: Tape Dental Injury: Teeth and Oropharynx as per pre-operative assessment

## 2015-07-24 NOTE — H&P (View-Only) (Signed)
Patient ID: Charlene Norris, female   DOB: 02/28/1941, 74 y.o.   MRN: [id number]  Chief Complaint  Patient presents with  . Abdominal Pain    HPI Charlene Norris is a 74 y.o. female.  Here today for evaluation of her gallbladder. She states she several years she has had nausea, no vomiting. But over the past several months it seems to be worse with occasional right abdominal pain and upper stomach. The pain comes and goes, feels like a "knife". Poor appetite. Can't tolerate greasy and fatty foods. CT scan was 07-03-15. She states she just "feels bad". She has als been short of breath with minimal activity for last4-6 weeks.She has not mentioned this to Dr. Patrecia Pace.  HPI  Past Medical History  Diagnosis Date  . Cataract   . Arthritis   . Hypertension   . Hyperlipidemia   . Pre-diabetes     Past Surgical History  Procedure Laterality Date  . Partial hysterectomy    . Joint replacement Bilateral 2009, 2010    knee  . Parathyroid exploration  2012    growth/ Dr Willeen Cass  . Exploratory laparotomy      Family History  Problem Relation Age of Onset  . Diabetes Father   . Hypertension Father   . Parkinson's disease Mother     Social History Social History  Substance Use Topics  . Smoking status: Former Smoker -- 5 years    Quit date: 11/09/1968  . Smokeless tobacco: Never Used  . Alcohol Use: No    Allergies  Allergen Reactions  . Allopurinol Shortness Of Breath  . Levofloxacin Shortness Of Breath    Chest pressure  . Ampicillin Itching  . Celebrex [Celecoxib] Hives  . Nexium [Esomeprazole Magnesium] Hives  . Zyrtec [Cetirizine] Other (See Comments)    Feel ill    Current Outpatient Prescriptions  Medication Sig Dispense Refill  . amoxicillin (AMOXIL) 500 MG capsule Take 500 mg by mouth as needed. procedures    . aspirin 325 MG tablet Take 325 mg by mouth daily.    . clidinium-chlordiazePOXIDE (LIBRAX) 5-2.5 MG per capsule TK 1 C PO QD PRN  1  .  glucosamine-chondroitin 500-400 MG tablet Take 1 tablet by mouth 2 (two) times daily.    Marland Kitchen lisinopril (PRINIVIL,ZESTRIL) 10 MG tablet TK 1 T PO D  2  . lovastatin (MEVACOR) 40 MG tablet TK 1 T PO QAM  2  . Omega-3 Fatty Acids (FISH OIL) 1200 MG CAPS Take by mouth daily.    . propranolol-hydrochlorothiazide (INDERIDE) 40-25 MG per tablet TK 1 T PO QAM  2  . ranitidine (ZANTAC) 150 MG tablet TK 1 T PO BID  0  . ULORIC 40 MG tablet TK 1 T PO ONCE D  2  . vitamin B-12 (CYANOCOBALAMIN) 1000 MCG tablet Take 1,000 mcg by mouth daily.    . Vitamin D, Ergocalciferol, (DRISDOL) 50000 UNITS CAPS capsule TK 1 C PO TWO TIMES A WEEK  2  . vitamin E 400 UNIT capsule Take 400 Units by mouth daily.     No current facility-administered medications for this visit.    Review of Systems Review of Systems  Constitutional: Positive for fatigue.  Respiratory: Positive for shortness of breath.   Cardiovascular: Negative.   Gastrointestinal: Positive for nausea and abdominal pain. Negative for vomiting, diarrhea and constipation.    Blood pressure 142/82, pulse 82, resp. rate 16, height  (1.575 m), weight 207 lb (93.895 kg).  Physical Exam  Physical Exam  Constitutional: She is oriented to person, place, and time. She appears well-developed and well-nourished.  HENT:  Mouth/Throat: Oropharynx is clear and moist.  Eyes: Conjunctivae are normal. No scleral icterus.  Neck: Neck supple.  Cardiovascular: Normal rate, regular rhythm and normal heart sounds.   Pulmonary/Chest: Effort normal and breath sounds normal.  Abdominal: Soft. Normal appearance and bowel sounds are normal. There is tenderness in the right upper quadrant.  Mild tenderness RUQ.  Lymphadenopathy:    She has no cervical adenopathy.  Neurological: She is alert and oriented to person, place, and time.  Skin: Skin is warm and dry.  Psychiatric: Her behavior is normal.    Data Reviewed Progress notes and recent CT scan, [place]. gallstones  and thickened GBW  Assessment    Cholelithiasis with chronic cholecystitis.    Plan   Recommended laparoscopic cholecystectomy. Laparoscopic Cholecystectomy with Intraoperative Cholangiogram. The procedure, including it's potential risks and complications (including but not limited to infection, bleeding, injury to intra-abdominal organs or bile ducts, bile leak, poor cosmetic result, sepsis and death) were discussed with the patient in detail. Non-operative options, including their inherent risks (acute calculous cholecystitis with possible choledocholithiasis or gallstone pancreatitis, with the risk of ascending cholangitis, sepsis, and death) were discussed as well. The patient expressed and understanding of what we discussed and wishes to proceed with laparoscopic cholecystectomy. The patient further understands that if it is technically not possible, or it is unsafe to proceed laparoscopically, that I will convert to an open cholecystectomy. Will need shortness of breath evaluation by Dr. Patrecia Pace prior to surgery. Patient has been scheduled for an appointment on 07-12-15 at 10 am.     Patient's surgery to be arranged once clearance is received from Dr. Patrecia Pace.      PCP:  Mina Marble 07/11/2015, 10:11 AM

## 2015-07-24 NOTE — Transfer of Care (Signed)
Immediate Anesthesia Transfer of Care Note  Patient: Charlene Norris  Procedure(s) Performed: Procedure(s): LAPAROSCOPIC CHOLECYSTECTOMY CONVERTED TO OPEN CHOLECYSTECTOMY  (N/A)  Patient Location: PACU  Anesthesia Type:General  Level of Consciousness: awake, alert , oriented and patient cooperative  Airway & Oxygen Therapy: Patient Spontanous Breathing and Patient connected to face mask oxygen  Post-op Assessment: Report given to RN, Post -op Vital signs reviewed and stable and Patient moving all extremities X 4  Post vital signs: Reviewed and stable  Last Vitals:  Filed Vitals:   07/24/15 1454  BP: 119/47  Pulse: 74  Temp: 36.2 C  Resp: 21    Complications: No apparent anesthesia complications

## 2015-07-24 NOTE — Progress Notes (Signed)
Pt came to floor from PACU. VSS. No complaints.

## 2015-07-24 NOTE — Progress Notes (Signed)
PHARMACIST - PHYSICIAN ORDER COMMUNICATION  CONCERNING: P&T Medication Policy on Herbal Medications  DESCRIPTION:  This patient's order for:  Glucosamine-Chondroitin  has been noted.  This product(s) is classified as an "herbal" or natural product. Due to a lack of definitive safety studies or FDA approval, nonstandard manufacturing practices, plus the potential risk of unknown drug-drug interactions while on inpatient medications, the Pharmacy and Therapeutics Committee does not permit the use of "herbal" or natural products of this type within [place].   ACTION TAKEN: The pharmacy department is unable to verify this order at this time and your patient has been informed of this safety policy. Please reevaluate patient's clinical condition at discharge and address if the herbal or natural product(s) should be resumed at that time.   

## 2015-07-24 NOTE — Interval H&P Note (Signed)
History and Physical Interval Note:  07/24/2015 11:19 AM  Charlene Norris  has presented today for surgery, with the diagnosis of cholelithiasis  The various methods of treatment have been discussed with the patient and family. After consideration of risks, benefits and other options for treatment, the patient has consented to  Procedure(s): LAPAROSCOPIC CHOLECYSTECTOMY (N/A) as a surgical intervention .  The patient's history has been reviewed, patient examined, no change in status, stable for surgery.  I have reviewed the patient's chart and labs.  Questions were answered to the patient's satisfaction.     Prinston Kynard G

## 2015-07-24 NOTE — Anesthesia Preprocedure Evaluation (Signed)
Anesthesia Evaluation  Patient identified by MRN, date of birth, ID band Patient awake    Reviewed: Allergy & Precautions, H&P , NPO status , Patient's Chart, lab work & pertinent test results, reviewed documented beta blocker date and time   History of Anesthesia Complications Negative for: history of anesthetic complications  Airway Mallampati: II  TM Distance: >3 FB Neck ROM: full    Dental no notable dental hx. (+) Loose, Teeth Intact   Pulmonary neg pulmonary ROS, former smoker,    Pulmonary exam normal breath sounds clear to auscultation       Cardiovascular Exercise Tolerance: Good hypertension, (-) angina(-) CAD, (-) Past MI, (-) Cardiac Stents and (-) CABG Normal cardiovascular exam+ dysrhythmias (Ocassionally since childhood, worked up but no problems) (-) Valvular Problems/Murmurs Rhythm:regular Rate:Normal     Neuro/Psych negative neurological ROS  negative psych ROS   GI/Hepatic Neg liver ROS, GERD  Medicated and Controlled,  Endo/Other  diabetes (Pre-diabetes)  Renal/GU negative Renal ROS  negative genitourinary   Musculoskeletal   Abdominal   Peds  Hematology negative hematology ROS (+)   Anesthesia Other Findings Past Medical History:   Cataract                                                     Arthritis                                                    Hypertension                                                 Hyperlipidemia                                               Pre-diabetes                                                 Dysrhythmia                                                  GERD (gastroesophageal reflux disease)                       Reproductive/Obstetrics negative OB ROS                             Anesthesia Physical Anesthesia Plan  ASA: II  Anesthesia Plan: General   Post-op Pain Management:    Induction:   Airway Management Planned:    Additional Equipment:   Intra-op Plan:   Post-operative Plan:   Informed Consent: I have reviewed  the patients History and Physical, chart, labs and discussed the procedure including the risks, benefits and alternatives for the proposed anesthesia with the patient or authorized representative who has indicated his/her understanding and acceptance.   Dental Advisory Given  Plan Discussed with: Anesthesiologist, CRNA and Surgeon  Anesthesia Plan Comments:         Anesthesia Quick Evaluation

## 2015-07-24 NOTE — Progress Notes (Signed)
Patient ID: Charlene Norris, female   DOB: 05/21/1941, 74 y.o.   MRN: [id number] VSS. Pt awake, c/o some nausea and some pain. Offered PCA-pt prefers to have oral med and intermittent iv med. Explained findings and procedure done today.  She can go home tomorrow or day after-Dr. Lemar Livings will follow in my abscence.

## 2015-07-25 DIAGNOSIS — K8000 Calculus of gallbladder with acute cholecystitis without obstruction: Secondary | ICD-10-CM | POA: Diagnosis not present

## 2015-07-25 LAB — COMPREHENSIVE METABOLIC PANEL
ALBUMIN: 3.1 g/dL — AB (ref 3.5–5.0)
ALK PHOS: 337 U/L — AB (ref 38–126)
ALT: 81 U/L — ABNORMAL HIGH (ref 14–54)
ANION GAP: 10 (ref 5–15)
AST: 69 U/L — AB (ref 15–41)
BUN: 16 mg/dL (ref 6–20)
CALCIUM: 9.2 mg/dL (ref 8.9–10.3)
CO2: 26 mmol/L (ref 22–32)
Chloride: 98 mmol/L — ABNORMAL LOW (ref 101–111)
Creatinine, Ser: 0.9 mg/dL (ref 0.44–1.00)
GFR calc Af Amer: >60 mL/min (ref >60)
GFR calc non Af Amer: >60 mL/min (ref >60)
GLUCOSE: 194 mg/dL — AB (ref 65–99)
Potassium: 4 mmol/L (ref 3.5–5.1)
SODIUM: 134 mmol/L — AB (ref 135–145)
Total Bilirubin: 0.9 mg/dL (ref 0.3–1.2)
Total Protein: 6.7 g/dL (ref 6.5–8.1)

## 2015-07-25 LAB — CBC
HEMATOCRIT: 34.2 % — AB (ref 35.0–47.0)
HEMOGLOBIN: 10.8 g/dL — AB (ref 12.0–16.0)
MCH: 26.1 pg (ref 26.0–34.0)
MCHC: 31.6 g/dL — AB (ref 32.0–36.0)
MCV: 82.3 fL (ref 80.0–100.0)
Platelets: 313 10*3/uL (ref 150–440)
RBC: 4.15 MIL/uL (ref 3.80–5.20)
RDW: 13.7 % (ref 11.5–14.5)
WBC: 16.7 10*3/uL — ABNORMAL HIGH (ref 3.6–11.0)

## 2015-07-25 NOTE — Progress Notes (Signed)
AVSS. Significant sero sang drainage. Pain manageable. Nausea improved with phenergan. Lungs: Clear. Cardio: RR. ABD: Obese, soft.  Wound: Small amount of drainage around [place] drain site. Drain: No bilious drainage. Labs: WBC: 16K, Alk phos and transaminases up. Bili  Normal. IMP: Doing well s/p open cholecystectomy. Plan: Decrease IV fluids, increase ambulation. D/C when tolerating diet and po meds.

## 2015-07-25 NOTE — Progress Notes (Signed)
Inpatient Diabetes Program Recommendations  AACE/ADA: New Consensus Statement on Inpatient Glycemic Control (2015)  Target Ranges:  Prepandial:   less than 140 mg/dL      Peak postprandial:   less than 180 mg/dL (1-2 hours)      Critically ill patients:  140 - 180 mg/dL   Review of Glycemic Control  Diabetes history: pre- diabetes documented.  No A1C Outpatient Diabetes medications: none Current orders for Inpatient glycemic control: none  Inpatient Diabetes Program Recommendations:     Please consider ordering an A1C to determine blood sugar control over the past 3 months.  This am fasting blood sugar was /dl.  Consider checking blood sugars tid and hs.  Susette Racer, RN, BA, MHA, CDE Diabetes Coordinator Inpatient Diabetes Program  [phone] (Team Pager) [phone] [facility] Office) 07/25/2015 4:44 PM

## 2015-07-25 NOTE — Progress Notes (Signed)
This student nurse ambulated patient down the hallway and back to patients room. Patient tolerated the walk and no O2 was needed. She was assisted back into bed after walk.

## 2015-07-25 NOTE — Anesthesia Postprocedure Evaluation (Signed)
  Anesthesia Post-op Note  Patient: Charlene Norris  Procedure(s) Performed: Procedure(s): LAPAROSCOPIC CHOLECYSTECTOMY CONVERTED TO OPEN CHOLECYSTECTOMY  (N/A)  Anesthesia type:General  Patient location: PACU  Post pain: Pain level controlled  Post assessment: Post-op Vital signs reviewed, Patient's Cardiovascular Status Stable, Respiratory Function Stable, Patent Airway and No signs of Nausea or vomiting  Post vital signs: Reviewed and stable  Last Vitals:  Filed Vitals:   07/25/15 0538  BP: 129/97  Pulse: 72  Temp: 36.4 C  Resp: 18    Level of consciousness: awake, alert  and patient cooperative  Complications: No apparent anesthesia complications

## 2015-07-26 ENCOUNTER — Encounter: Payer: Self-pay | Admitting: General Surgery

## 2015-07-26 DIAGNOSIS — K8000 Calculus of gallbladder with acute cholecystitis without obstruction: Secondary | ICD-10-CM | POA: Diagnosis not present

## 2015-07-26 LAB — CBC WITH DIFFERENTIAL/PLATELET
Basophils Absolute: 0.1 10*3/uL (ref 0–0.1)
Basophils Relative: 1 %
EOS ABS: 0.1 10*3/uL (ref 0–0.7)
Eosinophils Relative: 1 %
HCT: 30.1 % — ABNORMAL LOW (ref 35.0–47.0)
HEMOGLOBIN: 9.5 g/dL — AB (ref 12.0–16.0)
LYMPHS ABS: 1.7 10*3/uL (ref 1.0–3.6)
LYMPHS PCT: 13 %
MCH: 26 pg (ref 26.0–34.0)
MCHC: 31.7 g/dL — AB (ref 32.0–36.0)
MCV: 81.9 fL (ref 80.0–100.0)
MONOS PCT: 10 %
Monocytes Absolute: 1.4 10*3/uL — ABNORMAL HIGH (ref 0.2–0.9)
NEUTROS PCT: 75 %
Neutro Abs: 9.9 10*3/uL — ABNORMAL HIGH (ref 1.4–6.5)
Platelets: 243 10*3/uL (ref 150–440)
RBC: 3.68 MIL/uL — AB (ref 3.80–5.20)
RDW: 13.9 % (ref 11.5–14.5)
WBC: 13.2 10*3/uL — AB (ref 3.6–11.0)

## 2015-07-26 LAB — HEPATIC FUNCTION PANEL
ALBUMIN: 2.6 g/dL — AB (ref 3.5–5.0)
ALK PHOS: 202 U/L — AB (ref 38–126)
ALT: 43 U/L (ref 14–54)
AST: 25 U/L (ref 15–41)
BILIRUBIN TOTAL: 0.7 mg/dL (ref 0.3–1.2)
Bilirubin, Direct: 0.3 mg/dL (ref 0.1–0.5)
Indirect Bilirubin: 0.4 mg/dL (ref 0.3–0.9)
TOTAL PROTEIN: 5.8 g/dL — AB (ref 6.5–8.1)

## 2015-07-26 LAB — SURGICAL PATHOLOGY

## 2015-07-26 NOTE — Progress Notes (Signed)
AVSS. Nausea improved. Pain waxes and wanes. On oral meds with reasonable control. Has not ambulated in last 24 hours. Lungs: Clear. Cardio: RR. ABD: Tender RUQ, wound clean. JP: Sero sang. No bile. Labs: WBC trending down, now at 13K LFT's markedly better. Normal bili. Plan: Advance diet, ambulate, home in AM.

## 2015-07-26 NOTE — Progress Notes (Signed)
Student Nurse walked with patient one lap around the nurses station. Patient did very well and tolerated the walk. She did not need any O2 and is resting back in the bed.  Her goal is walk again in an hour.

## 2015-07-26 NOTE — Care Management (Signed)
Spoke with patient and husband. Form home alert and oriented does not use a walker at home but has one if needed. Independent. No needs identified for case management.

## 2015-07-27 DIAGNOSIS — K8000 Calculus of gallbladder with acute cholecystitis without obstruction: Secondary | ICD-10-CM | POA: Diagnosis not present

## 2015-07-27 MED ORDER — HYDROCODONE-ACETAMINOPHEN 5-325 MG PO TABS: 1.0000 | ORAL_TABLET | ORAL | Status: DC | PRN

## 2015-07-27 NOTE — Discharge Instructions (Signed)
Schedule and keep your follow-up appointments.  Take all medications as prescribed.  If you notice any redness, drainage, or swelling around the incision sites, notify you doctor.

## 2015-07-27 NOTE — Progress Notes (Signed)
Pt stable. Discharge instructions and education provided. Questions answered. Prescriptions given. Escorted out by staff.

## 2015-07-28 LAB — WOUND CULTURE: Culture: NO GROWTH

## 2015-07-31 ENCOUNTER — Encounter: Payer: Self-pay | Admitting: General Surgery

## 2015-07-31 ENCOUNTER — Ambulatory Visit (INDEPENDENT_AMBULATORY_CARE_PROVIDER_SITE_OTHER): Payer: Medicare HMO | Admitting: General Surgery

## 2015-07-31 VITALS — BP 138/70 | HR 82 | Resp 14 | Ht 62.0 in | Wt 205.0 lb

## 2015-07-31 DIAGNOSIS — K8064 Calculus of gallbladder and bile duct with chronic cholecystitis without obstruction: Secondary | ICD-10-CM

## 2015-07-31 LAB — ANAEROBIC CULTURE

## 2015-07-31 NOTE — Progress Notes (Signed)
This is a 74 year old female here today for her post op gallbladder surgry done on 07/24/15. Patient states she is doing well. Bowels moving regular. Staples removed and steri strips applied. Incision clean no infection noted. Lungs clear, abdomen soft. Patient had a markedly inflamed gall bladder with a large stone this was not done with laparoscope, open cholecystectomy was preformed with some difficulty. She was in the hospital for 3 days.  Follow up in 4 weeks. Gradually increase activity, avoid strenuous activity. The patient is aware to call back for any questions or concerns.    PCP:  Horton Chin

## 2015-07-31 NOTE — Patient Instructions (Signed)
Follow up in 4 weeks. Gradually increase activity, avoid strenuous activity. The patient is aware to call back for any questions or concerns.

## 2015-08-02 NOTE — Discharge Summary (Signed)
Physician Discharge Summary  Patient ID: Charlene Norris MRN: [id number] DOB/AGE: 04-25-1941 74 y.o.  Admit date: 07/24/2015 Discharge date: 08/02/2015  Admission Diagnoses:Chronic cholecystitis, cholelithiasis  Discharge Diagnoses: same Active Problems:   Cholecystitis with cholelithiasis   Discharged Condition: good   Hospital Course: This 74 year old female was brought in for elective laparoscopic cholecystectomy. A laparoscopic attempt was made however gallbladder was noted to be markedly scarred almost cement-like. Accordingly an open cholecystectomy was performed which itself was somewhat difficult since the cystic duct area could not be well dissected off. The gallbladder was removed near the Hartman's pouch area and sutured closed. The drain was placed. Postoperative course was basically uncomplicated. The drainage was serosanguineous and not bilious. After adequate pain control was achieved and the patient was tolerating a diet well she was discharged.  Consults:none  none  Significant Diagnostic Studies: none  Treatments: open cholecystectomysurgery:    Discharge Exam: Blood pressure 121/49, pulse 73, temperature 97.7 F (36.5 C), temperature source Oral, resp. rate 17, height  (1.575 m), weight 207 lb 3.7 oz (94 kg), SpO2 97 %.      Incision clean, healing well. Tolerating po well.. Pain control adequate Disposition: 01-Home or Self Care  Discharge Instructions    Diet - low sodium heart healthy    Complete by:  As directed      Discharge instructions    Complete by:  As directed   No driving until pain free.  May shower at any time. Dressings can be may be removed on Monday.  No lifting over 10 pounds.  Tylenol: If needed for soreness.  Norco (hydrocodone) if needed for pain. This medication may constipate.  Laxative of choice if needed.     Increase activity slowly    Complete by:  As directed             Medication List    STOP taking these  medications        amoxicillin 500 MG capsule  Commonly known as:  AMOXIL     famotidine 10 MG tablet  Commonly known as:  PEPCID     lisinopril 10 MG tablet  Commonly known as:  PRINIVIL,ZESTRIL      TAKE these medications        aspirin 325 MG tablet  Take 325 mg by mouth daily.     clidinium-chlordiazePOXIDE 5-2.5 MG per capsule  Commonly known as:  LIBRAX  TK 1 C PO QD PRN     Fish Oil 1200 MG Caps  Take by mouth daily.     glucosamine-chondroitin 500-400 MG tablet  Take 1 tablet by mouth daily.     HYDROcodone-acetaminophen 5-325 MG per tablet  Commonly known as:  NORCO  Take 1-2 tablets by mouth every 4 (four) hours as needed.     lovastatin 40 MG tablet  Commonly known as:  MEVACOR  TK 1 T PO QEVENING     propranolol-hydrochlorothiazide 40-25 MG per tablet  Commonly known as:  INDERIDE  TK 1 T PO QAM     ranitidine 150 MG tablet  Commonly known as:  ZANTAC  TK 1 T PO BID     ULORIC 40 MG tablet  Generic drug:  febuxostat  TK 1 T PO ONCE EVERY OTHER DAY     vitamin B-12 1000 MCG tablet  Commonly known as:  CYANOCOBALAMIN  Take 1,000 mcg by mouth daily.     Vitamin D (Ergocalciferol) 50000 UNITS Caps capsule  Commonly known as:  DRISDOL  TK 1 C PO TWO TIMES A WEEK     vitamin E 400 UNIT capsule  Take 400 Units by mouth daily.           Follow-up Information    Follow up with Kieth Brightly, MD In 1 week.   Specialties:  General Surgery, Radiology   Why:  Please call the office: [phone] on Monday, September 19 to schedule an appointment with Dr. Evette Cristal for the week of September 26. He may come to the office on Thursday, September 22 for staple removal with the nurse.   Contact information:   [address] [place] [address] [phone]       Signed: Kieth Brightly 08/02/2015, 8:46 AM

## 2015-08-07 ENCOUNTER — Other Ambulatory Visit: Payer: Self-pay | Admitting: Endocrinology

## 2015-08-07 DIAGNOSIS — Z1231 Encounter for screening mammogram for malignant neoplasm of breast: Secondary | ICD-10-CM

## 2015-08-08 ENCOUNTER — Ambulatory Visit (INDEPENDENT_AMBULATORY_CARE_PROVIDER_SITE_OTHER): Payer: Medicare HMO | Admitting: *Deleted

## 2015-08-08 DIAGNOSIS — K8064 Calculus of gallbladder and bile duct with chronic cholecystitis without obstruction: Secondary | ICD-10-CM

## 2015-08-08 NOTE — Patient Instructions (Signed)
Patient to followup as scheduled.

## 2015-08-08 NOTE — Progress Notes (Signed)
Patient came in today for a wound check.  The wound is clean, with no signs of infection noted. Small opening noted lateral side of incision, small amount silver nitrate used to the area, steri strips applied and gauze dressing. Follow up as scheduled.

## 2015-08-29 ENCOUNTER — Encounter: Payer: Self-pay | Admitting: General Surgery

## 2015-08-29 ENCOUNTER — Ambulatory Visit (INDEPENDENT_AMBULATORY_CARE_PROVIDER_SITE_OTHER): Payer: Medicare HMO | Admitting: General Surgery

## 2015-08-29 VITALS — BP 126/78 | HR 70 | Resp 14 | Ht 62.0 in | Wt 200.0 lb

## 2015-08-29 DIAGNOSIS — K8064 Calculus of gallbladder and bile duct with chronic cholecystitis without obstruction: Secondary | ICD-10-CM

## 2015-08-29 NOTE — Progress Notes (Signed)
This is a 74 year old female here today for her post op gallbladder surgry done on 07/24/15. Patient states she is doing well. Bowels moving regular. Incision is clean and healing well. Lungs are clear and abdominal is soft  Return as needed.  The patient is aware to call back for any questions or concerns.

## 2015-08-29 NOTE — Patient Instructions (Signed)
Patient to return as needed. 

## 2015-09-30 ENCOUNTER — Other Ambulatory Visit: Payer: Self-pay | Admitting: Endocrinology

## 2015-09-30 ENCOUNTER — Ambulatory Visit
Admission: RE | Admit: 2015-09-30 | Discharge: 2015-09-30 | Disposition: A | Discharge status: Home / Self Care | Payer: Medicare HMO | Source: Ambulatory Visit | Attending: Endocrinology | Admitting: Endocrinology

## 2015-09-30 DIAGNOSIS — Z1231 Encounter for screening mammogram for malignant neoplasm of breast: Secondary | ICD-10-CM | POA: Insufficient documentation

## 2016-08-11 ENCOUNTER — Other Ambulatory Visit: Payer: Self-pay | Admitting: Endocrinology

## 2016-08-11 DIAGNOSIS — Z1239 Encounter for other screening for malignant neoplasm of breast: Secondary | ICD-10-CM

## 2016-10-05 ENCOUNTER — Ambulatory Visit
Admission: RE | Admit: 2016-10-05 | Discharge: 2016-10-05 | Disposition: A | Discharge status: Home / Self Care | Payer: Medicare HMO | Source: Ambulatory Visit | Attending: Endocrinology | Admitting: Endocrinology

## 2016-10-05 DIAGNOSIS — Z1231 Encounter for screening mammogram for malignant neoplasm of breast: Secondary | ICD-10-CM | POA: Insufficient documentation

## 2016-10-05 DIAGNOSIS — Z1239 Encounter for other screening for malignant neoplasm of breast: Secondary | ICD-10-CM

## 2017-06-11 ENCOUNTER — Ambulatory Visit: Admit: 2017-06-11 | Payer: Medicare HMO | Admitting: Gastroenterology

## 2017-06-11 SURGERY — COLONOSCOPY WITH PROPOFOL
Anesthesia: General

## 2018-09-15 ENCOUNTER — Other Ambulatory Visit: Payer: Self-pay | Admitting: Endocrinology

## 2018-09-15 DIAGNOSIS — Z1231 Encounter for screening mammogram for malignant neoplasm of breast: Secondary | ICD-10-CM

## 2018-09-20 ENCOUNTER — Ambulatory Visit
Admission: RE | Admit: 2018-09-20 | Discharge: 2018-09-20 | Disposition: A | Discharge status: Home / Self Care | Payer: Medicare HMO | Source: Ambulatory Visit | Attending: Endocrinology | Admitting: Endocrinology

## 2018-09-20 DIAGNOSIS — Z1231 Encounter for screening mammogram for malignant neoplasm of breast: Secondary | ICD-10-CM | POA: Diagnosis present

## 2019-07-06 ENCOUNTER — Other Ambulatory Visit: Payer: Self-pay

## 2019-07-06 DIAGNOSIS — Z20822 Contact with and (suspected) exposure to covid-19: Secondary | ICD-10-CM

## 2019-07-08 LAB — NOVEL CORONAVIRUS, NAA: SARS-CoV-2, NAA: NOT DETECTED

## 2019-09-25 ENCOUNTER — Other Ambulatory Visit: Payer: Self-pay | Admitting: Endocrinology

## 2019-09-25 DIAGNOSIS — Z1231 Encounter for screening mammogram for malignant neoplasm of breast: Secondary | ICD-10-CM

## 2019-10-10 ENCOUNTER — Ambulatory Visit
Admission: RE | Admit: 2019-10-10 | Discharge: 2019-10-10 | Disposition: A | Discharge status: Home / Self Care | Payer: Medicare HMO | Source: Ambulatory Visit | Attending: Endocrinology | Admitting: Endocrinology

## 2019-10-10 DIAGNOSIS — Z1231 Encounter for screening mammogram for malignant neoplasm of breast: Secondary | ICD-10-CM

## 2019-10-12 ENCOUNTER — Other Ambulatory Visit: Payer: Self-pay | Admitting: Endocrinology

## 2019-10-12 DIAGNOSIS — R928 Other abnormal and inconclusive findings on diagnostic imaging of breast: Secondary | ICD-10-CM

## 2019-10-18 ENCOUNTER — Ambulatory Visit
Admission: RE | Admit: 2019-10-18 | Discharge: 2019-10-18 | Disposition: A | Discharge status: Home / Self Care | Payer: Medicare HMO | Source: Ambulatory Visit | Attending: Endocrinology | Admitting: Endocrinology

## 2019-10-18 ENCOUNTER — Other Ambulatory Visit: Payer: Self-pay

## 2019-10-18 DIAGNOSIS — R928 Other abnormal and inconclusive findings on diagnostic imaging of breast: Secondary | ICD-10-CM | POA: Insufficient documentation

## 2019-11-09 ENCOUNTER — Ambulatory Visit: Payer: Medicare HMO | Attending: Internal Medicine

## 2019-11-09 DIAGNOSIS — Z20822 Contact with and (suspected) exposure to covid-19: Secondary | ICD-10-CM

## 2019-11-15 LAB — NOVEL CORONAVIRUS, NAA

## 2019-11-16 ENCOUNTER — Ambulatory Visit: Payer: Medicare HMO | Attending: Internal Medicine

## 2019-11-16 DIAGNOSIS — Z20822 Contact with and (suspected) exposure to covid-19: Secondary | ICD-10-CM

## 2019-11-18 LAB — NOVEL CORONAVIRUS, NAA: SARS-CoV-2, NAA: NOT DETECTED

## 2019-12-01 ENCOUNTER — Ambulatory Visit: Payer: Medicare HMO | Attending: Internal Medicine

## 2019-12-01 DIAGNOSIS — Z23 Encounter for immunization: Secondary | ICD-10-CM | POA: Insufficient documentation

## 2019-12-01 NOTE — Progress Notes (Signed)
   Covid-19 Vaccination Clinic  Name:  Charlene Norris    MRN: [id number] DOB: Sep 13, 1941  12/01/2019  [name] was observed post Covid-19 immunization for 30 minutes based on pre-vaccination screening without incidence. She was provided with Vaccine Information Sheet and instruction to access the V-Safe system.   [name] was instructed to call 911 with any severe reactions post vaccine: Marland Kitchen Difficulty breathing  . Swelling of your face and throat  . A fast heartbeat  . A bad rash all over your body  . Dizziness and weakness    Immunizations Administered    Name Date Dose VIS Date Route   Pfizer COVID-19 Vaccine 12/01/2019 10:19 AM 0.3 mL 10/20/2019 Intramuscular   Manufacturer: [employer], [employer]   Lot: [id number]   NDC: [address]-6

## 2019-12-19 ENCOUNTER — Ambulatory Visit: Payer: Medicare HMO | Attending: Internal Medicine

## 2019-12-19 DIAGNOSIS — Z23 Encounter for immunization: Secondary | ICD-10-CM | POA: Insufficient documentation

## 2019-12-19 NOTE — Progress Notes (Signed)
   Covid-19 Vaccination Clinic  Name:  RHEANNE CORTOPASSI    MRN: [id number] DOB: [birth year]  12/19/2019  Ms. Barna was observed post Covid-19 immunization for 15 minutes without incidence. She was provided with Vaccine Information Sheet and instruction to access the V-Safe system.   Ms. Petree was instructed to call 911 with any severe reactions post vaccine: Marland Kitchen Difficulty breathing  . Swelling of your face and throat  . A fast heartbeat  . A bad rash all over your body  . Dizziness and weakness    Immunizations Administered    Name Date Dose VIS Date Route   Pfizer COVID-19 Vaccine 12/19/2019  8:23 AM 0.3 mL 10/20/2019 Intramuscular   Manufacturer: [employer], [employer]   Lot: [id number]   NDC: [address]-9

## 2020-08-24 ENCOUNTER — Ambulatory Visit: Payer: Medicare HMO | Attending: Internal Medicine

## 2020-08-24 DIAGNOSIS — Z23 Encounter for immunization: Secondary | ICD-10-CM

## 2020-08-24 NOTE — Progress Notes (Signed)
   Covid-19 Vaccination Clinic  Name:  INANNA TELFORD    MRN: [id number] DOB: 06/25/1941  08/24/2020  Ms. Lasker was observed post Covid-19 immunization for 15 minutes without incident. She was provided with Vaccine Information Sheet and instruction to access the V-Safe system.   Ms. Chambers was instructed to call 911 with any severe reactions post vaccine: Marland Kitchen Difficulty breathing  . Swelling of face and throat  . A fast heartbeat  . A bad rash all over body  . Dizziness and weakness

## 2021-11-13 ENCOUNTER — Other Ambulatory Visit: Payer: Self-pay | Admitting: Endocrinology

## 2021-11-13 DIAGNOSIS — Z1231 Encounter for screening mammogram for malignant neoplasm of breast: Secondary | ICD-10-CM

## 2021-12-23 ENCOUNTER — Other Ambulatory Visit: Payer: Self-pay

## 2021-12-23 ENCOUNTER — Ambulatory Visit (INDEPENDENT_AMBULATORY_CARE_PROVIDER_SITE_OTHER): Payer: Medicare HMO | Admitting: Gastroenterology

## 2021-12-23 VITALS — BP 172/82 | HR 60 | Temp 98.3°F | Ht 62.0 in | Wt 173.0 lb

## 2021-12-23 DIAGNOSIS — K8051 Calculus of bile duct without cholangitis or cholecystitis with obstruction: Secondary | ICD-10-CM | POA: Diagnosis not present

## 2021-12-23 DIAGNOSIS — K859 Acute pancreatitis without necrosis or infection, unspecified: Secondary | ICD-10-CM | POA: Insufficient documentation

## 2021-12-23 NOTE — Progress Notes (Signed)
° ° °Gastroenterology Consultation ° °Referring Provider:     Morayati, Charlene J, MD °Primary Care Physician:  Norris, Charlene J, MD °Primary Gastroenterologist:  Dr. Franci Norris     °Reason for Consultation:     Abnormal CT scan of the abdomen °      ° HPI:   °Charlene Norris is a 81 y.o. y/o female referred for consultation & management of abnormal CT scan of the abdomen by Dr. Morayati, Charlene J, MD. This patient comes in today after having a CT scan at [facility] that showed: ° °There are 3 hyperdense foci seen within the mid and distal common bile duct proximal to the pancreatic duct confluence and ampulla with the largest measuring 1.3 cm. This is causing severe upstream dilation of the common bile duct, left hepatic duct and intrahepatic bile ducts left greater than right. Findings are best seen on coronal series 602:47.  ° °No evidence of hepatic steatosis on this single phase CT examination. MRI could be performed if there is a desire to quantify  fat and/or iron and/or fibrosis  as clinically warranted  ° °The patient was then seen by surgery and recommended to have an ERCP prior to any further intervention.  The patient had this CT scan for the diagnosis of fatty liver.  It appears that the patient had a CT scan of the abdomen in 2016 that showed: ° °IMPRESSION:  ° °Markedly abnormal thick-walled gallbladder with mild pericholecystic inflammatory changes, no calcified gallstones visualized, and enlarged periportal nodes. Differential includes gallbladder carcinoma and cholecystitis with clinical correlation needed.   ° °The patient denies any abdominal pain nausea vomiting fevers or chills.  The patient had her gallbladder out after the CT scan in 2016. ° °Past Medical History:  °Diagnosis Date  ° Arthritis   ° Cataract   ° Dysrhythmia   ° GERD (gastroesophageal reflux disease)   ° Hyperlipidemia   ° Hypertension   ° Pre-diabetes   ° ° °Past Surgical History:  °Procedure Laterality Date  ° ABDOMINAL HYSTERECTOMY    °  partial  ° CHOLECYSTECTOMY N/A 07/24/2015  ° Procedure: LAPAROSCOPIC CHOLECYSTECTOMY CONVERTED TO OPEN CHOLECYSTECTOMY ;  Surgeon: Seeplaputhur G Sankar, MD;  Location: [facility] ORS;  Service: General;  Laterality: N/A;  ° EXPLORATORY LAPAROTOMY    ° JOINT REPLACEMENT Bilateral 2009, 2010  ° knee  ° PARATHYROID EXPLORATION  2012  ° growth/ Dr Bennett  ° PARTIAL HYSTERECTOMY    ° ° °Prior to Admission medications   °Medication Sig Start Date End Date Taking? Authorizing Provider  °aspirin 325 MG tablet Take 325 mg by mouth daily.    [provider]  °clidinium-chlordiazePOXIDE (LIBRAX) 5-2.5 MG per capsule TK 1 C PO QD PRN 05/16/15   [provider]  °glucosamine-chondroitin 500-400 MG tablet Take 1 tablet by mouth daily.     [provider]  °HYDROcodone-acetaminophen (NORCO) 5-325 MG per tablet Take 1-2 tablets by mouth every 4 (four) hours as needed. 07/27/15   Byrnett, Jeffrey W, MD  °lovastatin (MEVACOR) 40 MG tablet TK 1 T PO QEVENING 06/10/15   [provider]  °Omega-3 Fatty Acids (FISH OIL) 1200 MG CAPS Take by mouth daily.    [provider]  °propranolol-hydrochlorothiazide (INDERIDE) 40-25 MG per tablet TK 1 T PO QAM 06/12/15   [provider]  °ranitidine (ZANTAC) 150 MG tablet TK 1 T PO BID 06/10/15   [provider]  °ULORIC 40 MG tablet TK 1 T PO ONCE EVERY OTHER DAY   06/10/15   [provider]  vitamin B-12 (CYANOCOBALAMIN) 1000 MCG tablet Take 1,000 mcg by mouth daily.    [provider]  Vitamin D, Ergocalciferol, (DRISDOL) 50000 UNITS CAPS capsule TK 1 C PO TWO TIMES A WEEK 06/10/15   [provider]  vitamin E 400 UNIT capsule Take 400 Units by mouth daily.    [provider]    Family History  Problem Relation Age of Onset   Parkinson's disease Mother    Diabetes Father    Hypertension Father    Breast cancer Sister 84       DCIS     Social History   Tobacco Use   Smoking status: Former    Years:  5.00    Types: Cigarettes    Quit date: 11/09/1968    Years since quitting: 53.1   Smokeless tobacco: Never  Substance Use Topics   Alcohol use: No    Alcohol/week: 0.0 standard drinks   Drug use: No    Allergies as of 12/23/2021 - Review Complete 08/29/2015  Allergen Reaction Noted   Allopurinol Shortness Of Breath 07/11/2015   Levofloxacin Shortness Of Breath 07/11/2015   Ampicillin Itching 07/11/2015   Celebrex [celecoxib] Hives 07/11/2015   Nexium [esomeprazole magnesium] Hives 07/11/2015   Ranitidine  07/19/2015   Zyrtec [cetirizine] Other (See Comments) 07/11/2015    Review of Systems:    All systems reviewed and negative except where noted in HPI.   Physical Exam:  There were no vitals taken for this visit. No LMP recorded. Patient has had a hysterectomy. General:   Alert,  Well-developed, well-nourished, pleasant and cooperative in NAD Head:  Normocephalic and atraumatic. Eyes:  Sclera clear, no icterus.   Conjunctiva pink. Ears:  Normal auditory acuity. Neck:  Supple; no masses or thyromegaly. Lungs:  Respirations even and unlabored.  Clear throughout to auscultation.   No wheezes, crackles, or rhonchi. No acute distress. Heart:  Regular rate and rhythm; no murmurs, clicks, rubs, or gallops. Abdomen:  Normal bowel sounds.  No bruits.  Soft, non-tender and non-distended without masses, hepatosplenomegaly or hernias noted.  No guarding or rebound tenderness.  Negative Carnett sign.   Rectal:  Deferred.  Pulses:  Normal pulses noted. Extremities:  No clubbing or edema.  No cyanosis. Neurologic:  Alert and oriented x3;  grossly normal neurologically. Skin:  Intact without significant lesions or rashes.  No jaundice. Lymph Nodes:  No significant cervical adenopathy. Psych:  Alert and cooperative. Normal mood and affect.  Imaging Studies: No results found.  Assessment and Plan:   Charlene Norris is a 81 y.o. y/o female who comes in today with a history of a CT scan  showing multiple stones in the common bile duct.  The patient has had her gallbladder out in 2016. I'm not sure why the patient was originally referred to surgery but after being referred to surgery Dr. Bary Castilla had requested an ERCP.  The patient will be set up for an ERCP.  The patient has been explained the risks and benefits including pancreatitis bleeding perforation and death.  The patient states that she agrees to undergoing the ERCP.  The patient will be set up for ERCP.    Charlene Lame, MD. Marval Regal    Note: This dictation was prepared with Dragon dictation along with smaller phrase technology. Any transcriptional errors that result from this process are unintentional.

## 2021-12-23 NOTE — H&P (View-Only) (Signed)
Gastroenterology Consultation  Referring Provider:     Alan Mulder, MD Primary Care Physician:  Alan Mulder, MD Primary Gastroenterologist:  Dr. Servando Snare     Reason for Consultation:     Abnormal CT scan of the abdomen        HPI:   Charlene Norris is a 81 y.o. y/o female referred for consultation & management of abnormal CT scan of the abdomen by Dr. Patrecia Pace, Delsa Sale, MD. This patient comes in today after having a CT scan at [facility] that showed:  There are 3 hyperdense foci seen within the mid and distal common bile duct proximal to the pancreatic duct confluence and ampulla with the largest measuring 1.3 cm. This is causing severe upstream dilation of the common bile duct, left hepatic duct and intrahepatic bile ducts left greater than right. Findings are best seen on coronal series 602:47.   No evidence of hepatic steatosis on this single phase CT examination. MRI could be performed if there is a desire to quantify  fat and/or iron and/or fibrosis  as clinically warranted   The patient was then seen by surgery and recommended to have an ERCP prior to any further intervention.  The patient had this CT scan for the diagnosis of fatty liver.  It appears that the patient had a CT scan of the abdomen in 2016 that showed:  IMPRESSION:   Markedly abnormal thick-walled gallbladder with mild pericholecystic inflammatory changes, no calcified gallstones visualized, and enlarged periportal nodes. Differential includes gallbladder carcinoma and cholecystitis with clinical correlation needed.    The patient denies any abdominal pain nausea vomiting fevers or chills.  The patient had her gallbladder out after the CT scan in 2016.  Past Medical History:  Diagnosis Date   Arthritis    Cataract    Dysrhythmia    GERD (gastroesophageal reflux disease)    Hyperlipidemia    Hypertension    Pre-diabetes     Past Surgical History:  Procedure Laterality Date   ABDOMINAL HYSTERECTOMY      partial   CHOLECYSTECTOMY N/A 07/24/2015   Procedure: LAPAROSCOPIC CHOLECYSTECTOMY CONVERTED TO OPEN CHOLECYSTECTOMY ;  Surgeon: Kieth Brightly, MD;  Location: [facility] ORS;  Service: General;  Laterality: N/A;   EXPLORATORY LAPAROTOMY     JOINT REPLACEMENT Bilateral 2009, 2010   knee   PARATHYROID EXPLORATION  2012   growth/ Dr Willeen Cass   PARTIAL HYSTERECTOMY      Prior to Admission medications   Medication Sig Start Date End Date Taking? Authorizing Provider  aspirin 325 MG tablet Take 325 mg by mouth daily.    [provider]  clidinium-chlordiazePOXIDE (LIBRAX) 5-2.5 MG per capsule TK 1 C PO QD PRN 05/16/15   [provider]  glucosamine-chondroitin 500-400 MG tablet Take 1 tablet by mouth daily.     [provider]  HYDROcodone-acetaminophen (NORCO) 5-325 MG per tablet Take 1-2 tablets by mouth every 4 (four) hours as needed. 07/27/15   Earline Mayotte, MD  lovastatin (MEVACOR) 40 MG tablet TK 1 T PO QEVENING 06/10/15   [provider]  Omega-3 Fatty Acids (FISH OIL) 1200 MG CAPS Take by mouth daily.    [provider]  propranolol-hydrochlorothiazide (INDERIDE) 40-25 MG per tablet TK 1 T PO QAM 06/12/15   [provider]  ranitidine (ZANTAC) 150 MG tablet TK 1 T PO BID 06/10/15   [provider]  ULORIC 40 MG tablet TK 1 T PO ONCE EVERY OTHER DAY  06/10/15   [provider]  vitamin B-12 (CYANOCOBALAMIN) 1000 MCG tablet Take 1,000 mcg by mouth daily.    [provider]  Vitamin D, Ergocalciferol, (DRISDOL) 50000 UNITS CAPS capsule TK 1 C PO TWO TIMES A WEEK 06/10/15   [provider]  vitamin E 400 UNIT capsule Take 400 Units by mouth daily.    [provider]    Family History  Problem Relation Age of Onset   Parkinson's disease Mother    Diabetes Father    Hypertension Father    Breast cancer Sister 84       DCIS     Social History   Tobacco Use   Smoking status: Former    Years:  5.00    Types: Cigarettes    Quit date: 11/09/1968    Years since quitting: 53.1   Smokeless tobacco: Never  Substance Use Topics   Alcohol use: No    Alcohol/week: 0.0 standard drinks   Drug use: No    Allergies as of 12/23/2021 - Review Complete 08/29/2015  Allergen Reaction Noted   Allopurinol Shortness Of Breath 07/11/2015   Levofloxacin Shortness Of Breath 07/11/2015   Ampicillin Itching 07/11/2015   Celebrex [celecoxib] Hives 07/11/2015   Nexium [esomeprazole magnesium] Hives 07/11/2015   Ranitidine  07/19/2015   Zyrtec [cetirizine] Other (See Comments) 07/11/2015    Review of Systems:    All systems reviewed and negative except where noted in HPI.   Physical Exam:  There were no vitals taken for this visit. No LMP recorded. Patient has had a hysterectomy. General:   Alert,  Well-developed, well-nourished, pleasant and cooperative in NAD Head:  Normocephalic and atraumatic. Eyes:  Sclera clear, no icterus.   Conjunctiva pink. Ears:  Normal auditory acuity. Neck:  Supple; no masses or thyromegaly. Lungs:  Respirations even and unlabored.  Clear throughout to auscultation.   No wheezes, crackles, or rhonchi. No acute distress. Heart:  Regular rate and rhythm; no murmurs, clicks, rubs, or gallops. Abdomen:  Normal bowel sounds.  No bruits.  Soft, non-tender and non-distended without masses, hepatosplenomegaly or hernias noted.  No guarding or rebound tenderness.  Negative Carnett sign.   Rectal:  Deferred.  Pulses:  Normal pulses noted. Extremities:  No clubbing or edema.  No cyanosis. Neurologic:  Alert and oriented x3;  grossly normal neurologically. Skin:  Intact without significant lesions or rashes.  No jaundice. Lymph Nodes:  No significant cervical adenopathy. Psych:  Alert and cooperative. Normal mood and affect.  Imaging Studies: No results found.  Assessment and Plan:   Charlene Norris is a 81 y.o. y/o female who comes in today with a history of a CT scan  showing multiple stones in the common bile duct.  The patient has had her gallbladder out in 2016. I'm not sure why the patient was originally referred to surgery but after being referred to surgery Dr. Bary Castilla had requested an ERCP.  The patient will be set up for an ERCP.  The patient has been explained the risks and benefits including pancreatitis bleeding perforation and death.  The patient states that she agrees to undergoing the ERCP.  The patient will be set up for ERCP.    Lucilla Lame, MD. Marval Regal    Note: This dictation was prepared with Dragon dictation along with smaller phrase technology. Any transcriptional errors that result from this process are unintentional.

## 2021-12-25 ENCOUNTER — Other Ambulatory Visit: Payer: Self-pay

## 2021-12-25 ENCOUNTER — Ambulatory Visit
Admission: RE | Admit: 2021-12-25 | Discharge: 2021-12-25 | Disposition: A | Discharge status: Home / Self Care | Payer: Medicare HMO | Source: Ambulatory Visit | Attending: Endocrinology | Admitting: Endocrinology

## 2021-12-25 DIAGNOSIS — Z1231 Encounter for screening mammogram for malignant neoplasm of breast: Secondary | ICD-10-CM | POA: Insufficient documentation

## 2021-12-29 ENCOUNTER — Encounter: Payer: Self-pay | Admitting: Gastroenterology

## 2021-12-30 ENCOUNTER — Ambulatory Visit: Payer: Medicare HMO | Admitting: Anesthesiology

## 2021-12-30 ENCOUNTER — Encounter
Admission: RE | Disposition: A | Discharge status: Home / Self Care | Payer: Self-pay | Source: Home / Self Care | Attending: Gastroenterology

## 2021-12-30 ENCOUNTER — Ambulatory Visit: Payer: Medicare HMO

## 2021-12-30 ENCOUNTER — Encounter: Payer: Self-pay | Admitting: Gastroenterology

## 2021-12-30 ENCOUNTER — Ambulatory Visit
Admission: RE | Admit: 2021-12-30 | Discharge: 2021-12-30 | Disposition: A | Discharge status: Home / Self Care | Payer: Medicare HMO | Attending: Gastroenterology | Admitting: Gastroenterology

## 2021-12-30 DIAGNOSIS — R7303 Prediabetes: Secondary | ICD-10-CM | POA: Diagnosis not present

## 2021-12-30 DIAGNOSIS — Z79899 Other long term (current) drug therapy: Secondary | ICD-10-CM | POA: Insufficient documentation

## 2021-12-30 DIAGNOSIS — Z87891 Personal history of nicotine dependence: Secondary | ICD-10-CM | POA: Diagnosis not present

## 2021-12-30 DIAGNOSIS — E785 Hyperlipidemia, unspecified: Secondary | ICD-10-CM | POA: Insufficient documentation

## 2021-12-30 DIAGNOSIS — K8051 Calculus of bile duct without cholangitis or cholecystitis with obstruction: Secondary | ICD-10-CM

## 2021-12-30 DIAGNOSIS — I10 Essential (primary) hypertension: Secondary | ICD-10-CM | POA: Diagnosis not present

## 2021-12-30 DIAGNOSIS — R932 Abnormal findings on diagnostic imaging of liver and biliary tract: Secondary | ICD-10-CM | POA: Diagnosis not present

## 2021-12-30 DIAGNOSIS — K8050 Calculus of bile duct without cholangitis or cholecystitis without obstruction: Secondary | ICD-10-CM | POA: Insufficient documentation

## 2021-12-30 DIAGNOSIS — K219 Gastro-esophageal reflux disease without esophagitis: Secondary | ICD-10-CM | POA: Diagnosis not present

## 2021-12-30 HISTORY — PX: ERCP: SHX5425

## 2021-12-30 SURGERY — ERCP, WITH INTERVENTION IF INDICATED
Anesthesia: General

## 2021-12-30 MED ORDER — PROPOFOL 10 MG/ML IV BOLUS
INTRAVENOUS | Status: DC | PRN
  Administered 2021-12-30 (×2): 20 mg via INTRAVENOUS

## 2021-12-30 MED ORDER — INDOMETHACIN 50 MG RE SUPP
RECTAL | Status: AC
  Filled 2021-12-30: qty 1

## 2021-12-30 MED ORDER — SODIUM CHLORIDE 0.9 % IV SOLN: INTRAVENOUS | Status: DC

## 2021-12-30 MED ORDER — INDOMETHACIN 50 MG RE SUPP
RECTAL | Status: DC | PRN
  Administered 2021-12-30: 100 mg via RECTAL

## 2021-12-30 MED ORDER — PROPOFOL 500 MG/50ML IV EMUL
INTRAVENOUS | Status: DC | PRN
  Administered 2021-12-30: 200 ug/kg/min via INTRAVENOUS

## 2021-12-30 NOTE — Anesthesia Preprocedure Evaluation (Signed)
Anesthesia Evaluation  Patient identified by MRN, date of birth, ID band Patient awake    Reviewed: Allergy & Precautions, NPO status , Patient's Chart, lab work & pertinent test results  History of Anesthesia Complications Negative for: history of anesthetic complications  Airway Mallampati: II  TM Distance: >3 FB Neck ROM: Full    Dental  (+) Missing, Poor Dentition, Chipped   Pulmonary neg pulmonary ROS, neg sleep apnea, neg COPD, Patient abstained from smoking.Not current smoker, former smoker,    Pulmonary exam normal breath sounds clear to auscultation       Cardiovascular Exercise Tolerance: Good METShypertension, Pt. on medications (-) CAD and (-) Past MI + dysrhythmias Atrial Fibrillation  Rhythm:Regular Rate:Normal - Systolic murmurs    Neuro/Psych negative neurological ROS  negative psych ROS   GI/Hepatic GERD  ,(+)     (-) substance abuse  ,   Endo/Other  neg diabetes  Renal/GU negative Renal ROS     Musculoskeletal  (+) Arthritis ,   Abdominal   Peds  Hematology   Anesthesia Other Findings Past Medical History: No date: Arthritis No date: Cataract No date: Dysrhythmia No date: GERD (gastroesophageal reflux disease) No date: Hyperlipidemia No date: Hypertension No date: Pre-diabetes  Reproductive/Obstetrics                             Anesthesia Physical Anesthesia Plan  ASA: 3  Anesthesia Plan: General   Post-op Pain Management: Minimal or no pain anticipated   Induction: Intravenous  PONV Risk Score and Plan: 3 and Propofol infusion, TIVA and Ondansetron  Airway Management Planned: Nasal Cannula  Additional Equipment: None  Intra-op Plan:   Post-operative Plan:   Informed Consent: I have reviewed the patients History and Physical, chart, labs and discussed the procedure including the risks, benefits and alternatives for the proposed anesthesia with the  patient or authorized representative who has indicated his/her understanding and acceptance.     Dental advisory given  Plan Discussed with: CRNA and Surgeon  Anesthesia Plan Comments: (Discussed risks of anesthesia with patient, including possibility of difficulty with spontaneous ventilation under anesthesia necessitating airway intervention, PONV, and rare risks such as cardiac or respiratory or neurological events, and allergic reactions. Discussed the role of CRNA in patient's perioperative care. Patient understands.)        Anesthesia Quick Evaluation

## 2021-12-30 NOTE — Transfer of Care (Signed)
Immediate Anesthesia Transfer of Care Note  Patient: Charlene Norris  Procedure(s) Performed: ENDOSCOPIC RETROGRADE CHOLANGIOPANCREATOGRAPHY (ERCP)  Patient Location: PACU  Anesthesia Type:General  Level of Consciousness: awake, alert  and oriented  Airway & Oxygen Therapy: Patient Spontanous Breathing  Post-op Assessment: Report given to RN and Post -op Vital signs reviewed and stable  Post vital signs: Reviewed and stable  Last Vitals:  Vitals Value Taken Time  BP 155/72 12/30/21 1153  Temp 36.4 C 12/30/21 1150  Pulse 65 12/30/21 1156  Resp 23 12/30/21 1156  SpO2 100 % 12/30/21 1156  Vitals shown include unvalidated device data.  Last Pain:  Vitals:   12/30/21 1150  TempSrc: Temporal  PainSc:          Complications: No notable events documented.

## 2021-12-30 NOTE — Anesthesia Procedure Notes (Signed)
Date/Time: 12/30/2021 11:09 AM Performed by: Junious Silk, CRNA Pre-anesthesia Checklist: Patient identified, Emergency Drugs available, Suction available, Patient being monitored and Timeout performed Oxygen Delivery Method: Nasal cannula

## 2021-12-30 NOTE — Interval H&P Note (Signed)
Midge Minium, MD [facility] [address], [address] [place], [place] [address] Phone:[phone] Fax : [phone]  Primary Care Physician:  Alan Mulder, MD Primary Gastroenterologist:  Dr. Servando Snare  Pre-Procedure History & Physical: HPI:  Charlene Norris is a 81 y.o. female is here for an ERCP.   Past Medical History:  Diagnosis Date   Arthritis    Cataract    Dysrhythmia    GERD (gastroesophageal reflux disease)    Hyperlipidemia    Hypertension    Pre-diabetes     Past Surgical History:  Procedure Laterality Date   ABDOMINAL HYSTERECTOMY     partial   CHOLECYSTECTOMY N/A 07/24/2015   Procedure: LAPAROSCOPIC CHOLECYSTECTOMY CONVERTED TO OPEN CHOLECYSTECTOMY ;  Surgeon: Kieth Brightly, MD;  Location: [facility] ORS;  Service: General;  Laterality: N/A;   EXPLORATORY LAPAROTOMY     JOINT REPLACEMENT Bilateral 2009, 2010   knee   PARATHYROID EXPLORATION  2012   growth/ Dr Willeen Cass   PARTIAL HYSTERECTOMY      Prior to Admission medications   Medication Sig Start Date End Date Taking? Authorizing Provider  aspirin EC 81 MG tablet Take 81 mg by mouth daily. Swallow whole.   Yes [provider]  lovastatin (MEVACOR) 40 MG tablet TK 1 T PO QEVENING 06/10/15  Yes [provider]  propranolol-hydrochlorothiazide (INDERIDE) 40-25 MG per tablet TK 1 T PO QAM 06/12/15  Yes [provider]  aspirin 325 MG tablet Take 325 mg by mouth daily. Patient not taking: Reported on 12/30/2021    [provider]  clidinium-chlordiazePOXIDE (LIBRAX) 5-2.5 MG per capsule TK 1 C PO QD PRN Patient not taking: Reported on 12/23/2021 05/16/15   [provider]  glucosamine-chondroitin 500-400 MG tablet Take 1 tablet by mouth daily.  Patient not taking: Reported on 12/23/2021    [provider]  Omega-3 Fatty Acids (FISH OIL) 1200 MG CAPS Take by mouth daily. Patient not taking: Reported on 12/23/2021    [provider]  vitamin B-12  (CYANOCOBALAMIN) 1000 MCG tablet Take 1,000 mcg by mouth daily. Patient not taking: Reported on 12/23/2021    [provider]  vitamin E 400 UNIT capsule Take 400 Units by mouth daily. Patient not taking: Reported on 12/23/2021    [provider]    Allergies as of 12/24/2021 - Review Complete 12/23/2021  Allergen Reaction Noted   Allopurinol Shortness Of Breath 07/11/2015   Levofloxacin Shortness Of Breath 07/11/2015   Ampicillin Itching 07/11/2015   Celebrex [celecoxib] Hives 07/11/2015   Nexium [esomeprazole magnesium] Hives 07/11/2015   Ranitidine  07/19/2015   Zyrtec [cetirizine] Other (See Comments) 07/11/2015    Family History  Problem Relation Age of Onset   Parkinson's disease Mother    Diabetes Father    Hypertension Father    Breast cancer Sister 65       DCIS    Social History   Socioeconomic History   Marital status: Married    Spouse name: Not on file   Number of children: Not on file   Years of education: Not on file   Highest education level: Not on file  Occupational History   Not on file  Tobacco Use   Smoking status: Former    Years: 5.00    Types: Cigarettes    Quit date: 11/09/1968    Years since quitting: 53.1   Smokeless tobacco: Never  Vaping Use   Vaping Use: Never used  Substance and Sexual Activity   Alcohol  use: No    Alcohol/week: 0.0 standard drinks   Drug use: No   Sexual activity: Not on file  Other Topics Concern   Not on file  Social History Narrative   Not on file   Social Determinants of Health   Financial Resource Strain: Not on file  Food Insecurity: Not on file  Transportation Needs: Not on file  Physical Activity: Not on file  Stress: Not on file  Social Connections: Not on file  Intimate Partner Violence: Not on file    Review of Systems: See HPI, otherwise negative ROS  Physical Exam: BP (!) 181/90    Pulse 69    Temp (!) 97.5 F (36.4 C) (Temporal)    Resp 18    Ht 5\' 1"  (1.549 m)    Wt  76.2 kg    SpO2 100%    BMI 31.74 kg/m  General:   Alert,  pleasant and cooperative in NAD Head:  Normocephalic and atraumatic. Neck:  Supple; no masses or thyromegaly. Lungs:  Clear throughout to auscultation.    Heart:  Regular rate and rhythm. Abdomen:  Soft, nontender and nondistended. Normal bowel sounds, without guarding, and without rebound.   Neurologic:  Alert and  oriented x4;  grossly normal neurologically.  Impression/Plan: Charlene Norris is here for an ERCP to be performed for CBD stones  Risks, benefits, limitations, and alternatives regarding  ERCP have been reviewed with the patient.  Questions have been answered.  All parties agreeable.   Melony Overly, MD  12/30/2021, 11:03 AM

## 2021-12-30 NOTE — Anesthesia Postprocedure Evaluation (Signed)
Anesthesia Post Note  Patient: Charlene Norris  Procedure(s) Performed: ENDOSCOPIC RETROGRADE CHOLANGIOPANCREATOGRAPHY (ERCP)  Patient location during evaluation: Endoscopy Anesthesia Type: General Level of consciousness: awake and alert Pain management: pain level controlled Vital Signs Assessment: post-procedure vital signs reviewed and stable Respiratory status: spontaneous breathing, nonlabored ventilation, respiratory function stable and patient connected to nasal cannula oxygen Cardiovascular status: blood pressure returned to baseline and stable Postop Assessment: no apparent nausea or vomiting Anesthetic complications: no   No notable events documented.   Last Vitals:  Vitals:   12/30/21 1150 12/30/21 1200  BP: (!) 155/72 137/84  Pulse: 70 68  Resp: 18 19  Temp: (!) 36.4 C   SpO2: 98% 100%    Last Pain:  Vitals:   12/30/21 1150  TempSrc: Temporal  PainSc:                  Corinda Gubler

## 2021-12-30 NOTE — Op Note (Signed)
[facility] Gastroenterology Patient Name: Charlene Norris Procedure Date: 12/30/2021 10:57 AM MRN: [id number] Account #: [id number] Date of Birth: 08-17-1941 Admit Type: Outpatient Age: 81 Room: [facility] ENDO ROOM 4 Gender: Female Note Status: Finalized Instrument Name: Nolon Stalls [id number] Procedure:             ERCP Indications:           Common bile duct stone(s) Providers:             Midge Minium MD, MD Medicines:             Propofol per Anesthesia Complications:         No immediate complications. Procedure:             Pre-Anesthesia Assessment:                        - Prior to the procedure, a History and Physical was                         performed, and patient medications and allergies were                         reviewed. The patient's tolerance of previous                         anesthesia was also reviewed. The risks and benefits                         of the procedure and the sedation options and risks                         were discussed with the patient. All questions were                         answered, and informed consent was obtained. Prior                         Anticoagulants: The patient has taken no previous                         anticoagulant or antiplatelet agents. ASA Grade                         Assessment: II - A patient with mild systemic disease.                         After reviewing the risks and benefits, the patient                         was deemed in satisfactory condition to undergo the                         procedure.                        After obtaining informed consent, the scope was passed                         under direct vision. Throughout the procedure, the  patient's blood pressure, pulse, and oxygen                         saturations were monitored continuously. The                         Duodenoscope was introduced through the mouth, and                         used to inject  contrast into and used to inject                         contrast into the bile duct. The ERCP was accomplished                         without difficulty. The patient tolerated the                         procedure well. Findings:      The scout film was normal. The esophagus was successfully intubated       under direct vision. The scope was advanced to a normal major papilla in       the descending duodenum without detailed examination of the pharynx,       larynx and associated structures, and upper GI tract. The upper GI tract       was grossly normal. The bile duct was deeply cannulated with the       short-nosed traction sphincterotome. Contrast was injected. I personally       interpreted the bile duct images. There was brisk flow of contrast       through the ducts. Image quality was excellent. Contrast extended to the       entire biliary tree. The main bile duct contained filling defect(s)       thought to be a stone. A wire was passed into the biliary tree. A 10 mm       biliary sphincterotomy was made with a traction (standard)       sphincterotome using ERBE electrocautery. There was no       post-sphincterotomy bleeding. The biliary tree was swept with a 15 mm       balloon starting at the bifurcation. Two stones were removed. No stones       remained. Impression:            - A filling defect consistent with a stone was seen on                         the cholangiogram.                        - Choledocholithiasis was found. Complete removal was                         accomplished by biliary sphincterotomy and balloon                         extraction.                        - A biliary sphincterotomy was performed.                        -  The biliary tree was swept. Recommendation:        - Discharge patient to home.                        - Clear liquid diet.                        - Continue present medications.                        - Watch for pancreatitis,  bleeding, perforation, and                         cholangitis. Procedure Code(s):     --- Professional ---                        [phone], Endoscopic retrograde cholangiopancreatography                         (ERCP); with removal of calculi/debris from                         biliary/pancreatic duct(s)                        43262, Endoscopic retrograde cholangiopancreatography                         (ERCP); with sphincterotomy/papillotomy                        [id number], Endoscopic catheterization of the biliary                         ductal system, radiological supervision and                         interpretation Diagnosis Code(s):     --- Professional ---                        K80.50, Calculus of bile duct without cholangitis or                         cholecystitis without obstruction                        R93.2, Abnormal findings on diagnostic imaging of                         liver and biliary tract CPT copyright 2019 American Medical Association. All rights reserved. The codes documented in this report are preliminary and upon coder review may  be revised to meet current compliance requirements. Midge Minium MD, MD 12/30/2021 11:48:39 AM This report has been signed electronically. Number of Addenda: 0 Note Initiated On: 12/30/2021 10:57 AM Estimated Blood Loss:  Estimated blood loss: none.      [facility]

## 2021-12-31 ENCOUNTER — Encounter: Payer: Self-pay | Admitting: Gastroenterology

## 2022-07-03 ENCOUNTER — Observation Stay
Admission: EM | Admit: 2022-07-03 | Discharge: 2022-07-04 | Disposition: A | Discharge status: Home / Self Care | Payer: Medicare HMO | Attending: Internal Medicine | Admitting: Internal Medicine

## 2022-07-03 ENCOUNTER — Emergency Department: Payer: Medicare HMO

## 2022-07-03 ENCOUNTER — Other Ambulatory Visit: Payer: Self-pay

## 2022-07-03 DIAGNOSIS — E11A Type 2 diabetes mellitus without complications in remission: Secondary | ICD-10-CM | POA: Diagnosis present

## 2022-07-03 DIAGNOSIS — Z96653 Presence of artificial knee joint, bilateral: Secondary | ICD-10-CM | POA: Insufficient documentation

## 2022-07-03 DIAGNOSIS — Z7982 Long term (current) use of aspirin: Secondary | ICD-10-CM | POA: Diagnosis not present

## 2022-07-03 DIAGNOSIS — Z20822 Contact with and (suspected) exposure to covid-19: Secondary | ICD-10-CM | POA: Insufficient documentation

## 2022-07-03 DIAGNOSIS — I4891 Unspecified atrial fibrillation: Secondary | ICD-10-CM

## 2022-07-03 DIAGNOSIS — E876 Hypokalemia: Secondary | ICD-10-CM | POA: Diagnosis present

## 2022-07-03 DIAGNOSIS — E119 Type 2 diabetes mellitus without complications: Secondary | ICD-10-CM | POA: Diagnosis present

## 2022-07-03 DIAGNOSIS — E785 Hyperlipidemia, unspecified: Secondary | ICD-10-CM | POA: Diagnosis present

## 2022-07-03 DIAGNOSIS — Z87891 Personal history of nicotine dependence: Secondary | ICD-10-CM | POA: Diagnosis not present

## 2022-07-03 DIAGNOSIS — I10 Essential (primary) hypertension: Secondary | ICD-10-CM | POA: Diagnosis not present

## 2022-07-03 DIAGNOSIS — N390 Urinary tract infection, site not specified: Secondary | ICD-10-CM | POA: Diagnosis present

## 2022-07-03 DIAGNOSIS — R112 Nausea with vomiting, unspecified: Secondary | ICD-10-CM | POA: Diagnosis not present

## 2022-07-03 DIAGNOSIS — R42 Dizziness and giddiness: Secondary | ICD-10-CM | POA: Insufficient documentation

## 2022-07-03 DIAGNOSIS — R55 Syncope and collapse: Secondary | ICD-10-CM | POA: Diagnosis not present

## 2022-07-03 DIAGNOSIS — N3000 Acute cystitis without hematuria: Secondary | ICD-10-CM

## 2022-07-03 DIAGNOSIS — A419 Sepsis, unspecified organism: Secondary | ICD-10-CM | POA: Diagnosis not present

## 2022-07-03 DIAGNOSIS — R7303 Prediabetes: Secondary | ICD-10-CM | POA: Insufficient documentation

## 2022-07-03 DIAGNOSIS — Z79899 Other long term (current) drug therapy: Secondary | ICD-10-CM | POA: Diagnosis not present

## 2022-07-03 HISTORY — DX: Unspecified atrial fibrillation: I48.91

## 2022-07-03 HISTORY — DX: Acute pancreatitis without necrosis or infection, unspecified: K85.90

## 2022-07-03 HISTORY — DX: Paroxysmal atrial fibrillation: I48.0

## 2022-07-03 LAB — LIPASE, BLOOD: Lipase: 35 U/L (ref 11–51)

## 2022-07-03 LAB — CBC
HCT: 42.5 % (ref 36.0–46.0)
Hemoglobin: 14.2 g/dL (ref 12.0–15.0)
MCH: 28.2 pg (ref 26.0–34.0)
MCHC: 33.4 g/dL (ref 30.0–36.0)
MCV: 84.5 fL (ref 80.0–100.0)
Platelets: 228 10*3/uL (ref 150–400)
RBC: 5.03 MIL/uL (ref 3.87–5.11)
RDW: 13.3 % (ref 11.5–15.5)
WBC: 7.6 10*3/uL (ref 4.0–10.5)
nRBC: 0 % (ref 0.0–0.2)

## 2022-07-03 LAB — URINALYSIS, COMPLETE (UACMP) WITH MICROSCOPIC
Bilirubin Urine: NEGATIVE
Glucose, UA: NEGATIVE mg/dL
Hgb urine dipstick: NEGATIVE
Ketones, ur: 20 mg/dL — AB
Nitrite: NEGATIVE
Protein, ur: NEGATIVE mg/dL
Specific Gravity, Urine: 1.011 (ref 1.005–1.030)
pH: 7 (ref 5.0–8.0)

## 2022-07-03 LAB — COMPREHENSIVE METABOLIC PANEL
ALT: 16 U/L (ref 0–44)
AST: 24 U/L (ref 15–41)
Albumin: 4.2 g/dL (ref 3.5–5.0)
Alkaline Phosphatase: 69 U/L (ref 38–126)
Anion gap: 11 (ref 5–15)
BUN: 18 mg/dL (ref 8–23)
CO2: 23 mmol/L (ref 22–32)
Calcium: 10.1 mg/dL (ref 8.9–10.3)
Chloride: 105 mmol/L (ref 98–111)
Creatinine, Ser: 0.84 mg/dL (ref 0.44–1.00)
GFR, Estimated: >60 mL/min (ref >60)
Glucose, Bld: 160 mg/dL — ABNORMAL HIGH (ref 70–99)
Potassium: 3.4 mmol/L — ABNORMAL LOW (ref 3.5–5.1)
Sodium: 139 mmol/L (ref 135–145)
Total Bilirubin: 0.8 mg/dL (ref 0.3–1.2)
Total Protein: 7.2 g/dL (ref 6.5–8.1)

## 2022-07-03 LAB — TROPONIN I (HIGH SENSITIVITY)
Troponin I (High Sensitivity): 5 ng/L (ref <18)
Troponin I (High Sensitivity): 10 ng/L (ref <18)

## 2022-07-03 LAB — PROCALCITONIN: Procalcitonin: <0.1 ng/mL

## 2022-07-03 LAB — HEMOGLOBIN A1C
Hgb A1c MFr Bld: 4.9 % (ref 4.8–5.6)
Mean Plasma Glucose: 93.93 mg/dL

## 2022-07-03 LAB — APTT: aPTT: 32 seconds (ref 24–36)

## 2022-07-03 LAB — LACTIC ACID, PLASMA: Lactic Acid, Venous: 1.2 mmol/L (ref 0.5–1.9)

## 2022-07-03 LAB — MAGNESIUM: Magnesium: 2 mg/dL (ref 1.7–2.4)

## 2022-07-03 LAB — SARS CORONAVIRUS 2 BY RT PCR: SARS Coronavirus 2 by RT PCR: NEGATIVE

## 2022-07-03 LAB — TSH: TSH: 2.012 u[IU]/mL (ref 0.350–4.500)

## 2022-07-03 MED ORDER — METOCLOPRAMIDE HCL 5 MG/ML IJ SOLN
10.0000 mg | Freq: Once | INTRAMUSCULAR | Status: AC
  Administered 2022-07-03: 10 mg via INTRAVENOUS
  Filled 2022-07-03: qty 2

## 2022-07-03 MED ORDER — ONDANSETRON HCL 4 MG/2ML IJ SOLN: 4.0000 mg | Freq: Three times a day (TID) | INTRAMUSCULAR | Status: DC | PRN

## 2022-07-03 MED ORDER — HEPARIN BOLUS VIA INFUSION
2000.0000 [IU] | Freq: Once | INTRAVENOUS | Status: DC
Start: 2022-07-03 — End: 2022-07-03
  Filled 2022-07-03: qty 2000

## 2022-07-03 MED ORDER — PRAVASTATIN SODIUM 20 MG PO TABS: 40.0000 mg | ORAL_TABLET | Freq: Every day | ORAL | Status: DC

## 2022-07-03 MED ORDER — DILTIAZEM HCL 25 MG/5ML IV SOLN
10.0000 mg | Freq: Once | INTRAVENOUS | Status: AC
  Administered 2022-07-03: 10 mg via INTRAVENOUS
  Filled 2022-07-03: qty 5

## 2022-07-03 MED ORDER — METOPROLOL TARTRATE 25 MG PO TABS: 12.5000 mg | ORAL_TABLET | Freq: Four times a day (QID) | ORAL | Status: DC

## 2022-07-03 MED ORDER — METOPROLOL TARTRATE 5 MG/5ML IV SOLN: 5.0000 mg | Freq: Once | INTRAVENOUS | Status: DC

## 2022-07-03 MED ORDER — ENOXAPARIN SODIUM 40 MG/0.4ML IJ SOSY: 40.0000 mg | PREFILLED_SYRINGE | INTRAMUSCULAR | Status: DC

## 2022-07-03 MED ORDER — SODIUM CHLORIDE 0.9 % IV SOLN
1.0000 g | INTRAVENOUS | Status: DC
  Administered 2022-07-03: 1 g via INTRAVENOUS
  Filled 2022-07-03: qty 10

## 2022-07-03 MED ORDER — SODIUM CHLORIDE 0.9 % IV BOLUS
1000.0000 mL | Freq: Once | INTRAVENOUS | Status: AC
  Administered 2022-07-03: 1000 mL via INTRAVENOUS

## 2022-07-03 MED ORDER — ACETAMINOPHEN 325 MG PO TABS: 650.0000 mg | ORAL_TABLET | Freq: Four times a day (QID) | ORAL | Status: DC | PRN

## 2022-07-03 MED ORDER — HEPARIN BOLUS VIA INFUSION
3200.0000 [IU] | Freq: Once | INTRAVENOUS | Status: AC
  Administered 2022-07-03: 3200 [IU] via INTRAVENOUS
  Filled 2022-07-03: qty 3200

## 2022-07-03 MED ORDER — HEPARIN (PORCINE) 25000 UT/250ML-% IV SOLN
1000.0000 [IU]/h | INTRAVENOUS | Status: DC
Start: 2022-07-03 — End: 2022-07-04
  Administered 2022-07-03: 1000 [IU]/h via INTRAVENOUS
  Filled 2022-07-03: qty 250

## 2022-07-03 MED ORDER — POTASSIUM CHLORIDE CRYS ER 20 MEQ PO TBCR
40.0000 meq | EXTENDED_RELEASE_TABLET | Freq: Once | ORAL | Status: AC
  Administered 2022-07-03: 40 meq via ORAL
  Filled 2022-07-03: qty 2

## 2022-07-03 MED ORDER — DILTIAZEM HCL-DEXTROSE 125-5 MG/125ML-% IV SOLN (PREMIX)
5.0000 mg/h | INTRAVENOUS | Status: DC
  Administered 2022-07-03: 5 mg/h via INTRAVENOUS
  Filled 2022-07-03 (×2): qty 125

## 2022-07-03 MED ORDER — ASPIRIN 81 MG PO TBEC
81.0000 mg | DELAYED_RELEASE_TABLET | Freq: Every day | ORAL | Status: DC
  Administered 2022-07-03 – 2022-07-04 (×2): 81 mg via ORAL
  Filled 2022-07-03 (×2): qty 1

## 2022-07-03 MED ORDER — ONDANSETRON HCL 4 MG/2ML IJ SOLN
4.0000 mg | Freq: Once | INTRAMUSCULAR | Status: AC
  Administered 2022-07-03: 4 mg via INTRAVENOUS
  Filled 2022-07-03: qty 2

## 2022-07-03 MED ORDER — HYDRALAZINE HCL 20 MG/ML IJ SOLN
5.0000 mg | INTRAMUSCULAR | Status: DC | PRN
Start: 2022-07-03 — End: 2022-07-04

## 2022-07-03 NOTE — Assessment & Plan Note (Signed)
-  see above 

## 2022-07-03 NOTE — ED Notes (Signed)
PT transferred to hospital bed. No needs or concerns expressed.

## 2022-07-03 NOTE — H&P (Signed)
History and Physical    Charlene Norris ERX:[id number] DOB: November 28, 1940 DOA: 07/03/2022  Referring MD/NP/PA:   PCP: Alan Mulder, MD   Patient coming from:  The patient is coming from home.  At baseline, pt is independent for most of ADL.        Chief Complaint: nausea and vomiting  HPI: Charlene Norris is a 81 y.o. female with medical history significant of HTN, HLD, pre-DM, GERD, pancreatitis, PAF not on anticoagulants, who presents with nausea and vomiting.  Patient states that that she started having nausea vomiting in this early morning.  She has vomited more than 10 times, with nonbilious nonbloody vomiting.  Denies diarrhea or abdominal pain.  She also reports poor balance and dizziness.  No unilateral numbness or tinglings, no facial droop or slurred speech.  No loss of consciousness.  Patient does not have chest pain, cough, shortness breath.  Denies palpitation.  She has increased urinary frequency, no dysuria or burning with urination.  Patient was found to have atrial fibrillation with RVR, heart rate up to 147.  Data reviewed independently and ED Course: pt was found to have WBC 7.6, troponin level 5, 10, lipase 35, liver function normal, positive urinalysis (clear appearance, large amount of leukocyte, rare bacteria, WBC 21-50), negative COVID PCR, GFR> 60, potassium 3.4, Mg 2.0.  Temperature normal, blood pressure 103/74, 29, oxygen sat 99% on room air.  CT of head negative for acute intracranial abnormalities.  MRI of brain negative for stroke.  Patient is placed on PCU for observation. Dr. Mariah Milling of card is consulted.   EKG: I have personally reviewed.  Atrial fibrillation, QTc 474, Q wave in lead III, nonspecific T wave change   Review of Systems:   General: no fevers, chills, no body weight gain, has fatigue HEENT: no blurry vision, hearing changes or sore throat Respiratory: no dyspnea, coughing, wheezing CV: no chest pain, no palpitations GI: has nausea,  vomiting, no abdominal pain, diarrhea, constipation GU: no dysuria, burning on urination, increased urinary frequency, hematuria  Ext: no leg edema Neuro: no unilateral weakness, numbness, or tingling, no vision change or hearing loss. has dizziness Skin: no rash, no skin tear. MSK: No muscle spasm, no deformity, no limitation of range of movement in spin Heme: No easy bruising.  Travel history: No recent long distant travel.   Allergy:  Allergies  Allergen Reactions   Allopurinol Shortness Of Breath   Levofloxacin Shortness Of Breath    Chest pressure   Ampicillin Itching   Celebrex [Celecoxib] Hives   Nexium [Esomeprazole Magnesium] Hives   Ranitidine    Zyrtec [Cetirizine] Other (See Comments)    Feel ill    Past Medical History:  Diagnosis Date   Arthritis    Cataract    GERD (gastroesophageal reflux disease)    Hyperlipidemia    Hypertension    PAF (paroxysmal atrial fibrillation) (HCC)    Pancreatitis    Pre-diabetes     Past Surgical History:  Procedure Laterality Date   ABDOMINAL HYSTERECTOMY     partial   CHOLECYSTECTOMY N/A 07/24/2015   Procedure: LAPAROSCOPIC CHOLECYSTECTOMY CONVERTED TO OPEN CHOLECYSTECTOMY ;  Surgeon: Kieth Brightly, MD;  Location: [facility] ORS;  Service: General;  Laterality: N/A;   ERCP N/A 12/30/2021   Procedure: ENDOSCOPIC RETROGRADE CHOLANGIOPANCREATOGRAPHY (ERCP);  Surgeon: Midge Minium, MD;  Location: [facility] ENDOSCOPY;  Service: Endoscopy;  Laterality: N/A;   EXPLORATORY LAPAROTOMY     JOINT REPLACEMENT Bilateral 2009, 2010   knee  PARATHYROID EXPLORATION  2012   growth/ Dr Willeen CassBennett   PARTIAL HYSTERECTOMY      Social History:  reports that she quit smoking about 53 years ago. Her smoking use included cigarettes. She has never used smokeless tobacco. She reports that she does not drink alcohol and does not use drugs.  Family History:  Family History  Problem Relation Age of Onset   Parkinson's disease Mother    Diabetes  Father    Hypertension Father    Breast cancer Sister [id number]       DCIS     Prior to Admission medications   Medication Sig Start Date End Date Taking? Authorizing Provider  aspirin 325 MG tablet Take 325 mg by mouth daily. Patient not taking: Reported on 12/30/2021    [provider]  aspirin EC 81 MG tablet Take 81 mg by mouth daily. Swallow whole.    [provider]  clidinium-chlordiazePOXIDE (LIBRAX) 5-2.5 MG per capsule TK 1 C PO QD PRN Patient not taking: Reported on 12/23/2021 05/16/15   [provider]  glucosamine-chondroitin 500-400 MG tablet Take 1 tablet by mouth daily.  Patient not taking: Reported on 12/23/2021    [provider]  lovastatin (MEVACOR) 40 MG tablet TK 1 T PO QEVENING 06/10/15   [provider]  Omega-3 Fatty Acids (FISH OIL) 1200 MG CAPS Take by mouth daily. Patient not taking: Reported on 12/23/2021    [provider]  propranolol-hydrochlorothiazide (INDERIDE) 40-25 MG per tablet TK 1 T PO QAM 06/12/15   [provider]  vitamin B-12 (CYANOCOBALAMIN) 1000 MCG tablet Take 1,000 mcg by mouth daily. Patient not taking: Reported on 12/23/2021    [provider]  vitamin E 400 UNIT capsule Take 400 Units by mouth daily. Patient not taking: Reported on 12/23/2021    [provider]    Physical Exam: Vitals:   07/03/22 1720 07/03/22 1730 07/03/22 1800 07/03/22 1815  BP:  (!) 119/51 (!) 112/55   Pulse: 89 79 (!) 112 91  Resp: 17 15 18 16   Temp:      TempSrc:      SpO2: 99% 100% 100% 99%  Weight:      Height:       General: Not in acute distress HEENT:       Eyes: PERRL, EOMI, no scleral icterus.       ENT: No discharge from the ears and nose, no pharynx injection, no tonsillar enlargement.        Neck: No JVD, no bruit, no mass felt. Heme: No neck lymph node enlargement. Cardiac: S1/S2, irregularly irregular rhythm.  No murmurs, No gallops or rubs. Respiratory: No rales, wheezing,  rhonchi or rubs. GI: Soft, nondistended, nontender, no rebound pain, no organomegaly, BS present. GU: No hematuria Ext: No pitting leg edema bilaterally. 1+DP/PT pulse bilaterally. Musculoskeletal: No joint deformities, No joint redness or warmth, no limitation of ROM in spin. Skin: No rashes.  Neuro: Alert, oriented X3, cranial nerves II-XII grossly intact, moves all extremities normally.  Psych: Patient is not psychotic, no suicidal or hemocidal ideation.  Labs on Admission: I have personally reviewed following labs and imaging studies  CBC: Recent Labs  Lab 07/03/22 0816  WBC 7.6  HGB 14.2  HCT 42.5  MCV 84.5  PLT 228   Basic Metabolic Panel: Recent Labs  Lab 07/03/22 0816 07/03/22 1025  NA 139  --   K 3.4*  --   CL 105  --   CO2 23  --  GLUCOSE 160*  --   BUN 18  --   CREATININE 0.84  --   CALCIUM 10.1  --   MG  --  2.0   GFR: Estimated Creatinine Clearance: 49 mL/min (by C-G formula based on SCr of 0.84 mg/dL). Liver Function Tests: Recent Labs  Lab 07/03/22 0816  AST 24  ALT 16  ALKPHOS 69  BILITOT 0.8  PROT 7.2  ALBUMIN 4.2   Recent Labs  Lab 07/03/22 0816  LIPASE 35   No results for input(s): "AMMONIA" in the last 168 hours. Coagulation Profile: No results for input(s): "INR", "PROTIME" in the last 168 hours. Cardiac Enzymes: No results for input(s): "CKTOTAL", "CKMB", "CKMBINDEX", "TROPONINI" in the last 168 hours. BNP (last 3 results) No results for input(s): "PROBNP" in the last 8760 hours. HbA1C: No results for input(s): "HGBA1C" in the last 72 hours. CBG: No results for input(s): "GLUCAP" in the last 168 hours. Lipid Profile: No results for input(s): "CHOL", "HDL", "LDLCALC", "TRIG", "CHOLHDL", "LDLDIRECT" in the last 72 hours. Thyroid Function Tests: Recent Labs    07/03/22 1602  TSH 2.012   Anemia Panel: No results for input(s): "VITAMINB12", "FOLATE", "FERRITIN", "TIBC", "IRON", "RETICCTPCT" in the last 72 hours. Urine  analysis:    Component Value Date/Time   COLORURINE YELLOW (A) 07/03/2022 1025   APPEARANCEUR CLEAR (A) 07/03/2022 1025   LABSPEC 1.011 07/03/2022 1025   PHURINE 7.0 07/03/2022 1025   GLUCOSEU NEGATIVE 07/03/2022 1025   HGBUR NEGATIVE 07/03/2022 1025   BILIRUBINUR NEGATIVE 07/03/2022 1025   KETONESUR 20 (A) 07/03/2022 1025   PROTEINUR NEGATIVE 07/03/2022 1025   NITRITE NEGATIVE 07/03/2022 1025   LEUKOCYTESUR LARGE (A) 07/03/2022 1025   Sepsis Labs: @LABRCNTIP (procalcitonin:4,lacticidven:4) ) Recent Results (from the past 240 hour(s))  SARS Coronavirus 2 by RT PCR (hospital order, performed in [facility] hospital lab) *cepheid single result test* Anterior Nasal Swab     Status: None   Collection Time: 07/03/22 10:57 AM   Specimen: Anterior Nasal Swab  Result Value Ref Range Status   SARS Coronavirus 2 by RT PCR NEGATIVE NEGATIVE Final    Comment: (NOTE) SARS-CoV-2 target nucleic acids are NOT DETECTED.  The SARS-CoV-2 RNA is generally detectable in upper and lower respiratory specimens during the acute phase of infection. The lowest concentration of SARS-CoV-2 viral copies this assay can detect is 250 copies / mL. A negative result does not preclude SARS-CoV-2 infection and should not be used as the sole basis for treatment or other patient management decisions.  A negative result may occur with improper specimen collection / handling, submission of specimen other than nasopharyngeal swab, presence of viral mutation(s) within the areas targeted by this assay, and inadequate number of viral copies (<250 copies / mL). A negative result must be combined with clinical observations, patient history, and epidemiological information.  Fact Sheet for Patients:   07/05/22  Fact Sheet for Healthcare Providers: RoadLapTop.co.za  This test is not yet approved or  cleared by the http://kim-miller.com/ FDA and has been authorized for  detection and/or diagnosis of SARS-CoV-2 by FDA under an Emergency Use Authorization (EUA).  This EUA will remain in effect (meaning this test can be used) for the duration of the COVID-19 declaration under Section 564(b)(1) of the Act, 21 U.S.C. section 360bbb-3(b)(1), unless the authorization is terminated or revoked sooner.  Performed at [facility], [address], [place], [place]      Radiological Exams on Admission: MR BRAIN WO CONTRAST  Result Date:  07/03/2022 CLINICAL DATA:  Dizziness, non-specific EXAM: MRI HEAD WITHOUT CONTRAST TECHNIQUE: Multiplanar, multiecho pulse sequences of the brain and surrounding structures were obtained without intravenous contrast. COMPARISON:  None Available. FINDINGS: Brain: There is no acute infarction or intracranial hemorrhage. There is no intracranial mass, mass effect, or edema. There is no hydrocephalus or extra-axial fluid collection. Ventricles and sulci are within normal limits in size and configuration. Patchy and confluent areas of T2 hyperintensity in the supratentorial white matter are nonspecific but may reflect mild to moderate chronic microvascular ischemic changes. Vascular: Major vessel flow voids at the skull base are preserved. Skull and upper cervical spine: Normal marrow signal is preserved. Sinuses/Orbits: Trace mucosal thickening.  Orbits are unremarkable. Other: Sella is unremarkable.  Mastoid air cells are clear. IMPRESSION: No evidence of recent infarction, hemorrhage, or mass. Chronic microvascular ischemic changes. Electronically Signed   By: Guadlupe Spanish M.D.   On: 07/03/2022 12:19   CT HEAD WO CONTRAST ( )  Result Date: 07/03/2022 CLINICAL DATA:  Dizziness, nonspecific. EXAM: CT HEAD WITHOUT CONTRAST TECHNIQUE: Contiguous axial images were obtained from the base of the skull through the vertex without intravenous contrast. RADIATION DOSE REDUCTION: This exam was performed according to the departmental  dose-optimization program which includes automated exposure control, adjustment of the mA and/or kV according to patient size and/or use of iterative reconstruction technique. COMPARISON:  None Available. FINDINGS: Brain: No evidence of acute infarction, hemorrhage, hydrocephalus, extra-axial collection or mass lesion/mass effect. Low-attenuation of the periventricular white matter presumed chronic microvascular ischemic changes. Vascular: No hyperdense vessel or unexpected calcification. Skull: Normal. Negative for fracture or focal lesion. Sinuses/Orbits: No acute finding. Other: None. IMPRESSION: 1. No acute intracranial abnormality. 2. Low-attenuation of the periventricular white matter presumed chronic microvascular ischemic changes. Electronically Signed   By: Larose Hires D.O.   On: 07/03/2022 09:05      Assessment/Plan Principal Problem:   Atrial fibrillation with RVR (HCC) Active Problems:   Sepsis (HCC)   UTI (urinary tract infection)   Hypertension   Hyperlipidemia   Nausea & vomiting   Pre-diabetes   Hypokalemia   Assessment and Plan: * Atrial fibrillation with RVR (HCC) HR is up to 140s. Started Cardizem drip in ED.  Consulted Dr. Mariah Milling of cardiology. CHA2DS2-VASc at least 4, will need chronic AC.   -place in PCU for obs - IV heparin per cardiac recommendation -Continue Cardizem drip -Added metoprolol 12.5 mg every 6 hours by cardiologist -Check TSH --> 2.012   Sepsis (HCC) Sepsis due to UTI: Patient admits criteria for sepsis with heart rate 140s and RR 29.  No fever or leukocytosis. -IV Rocephin - f/u Urine culture -Check procalcitonin level and lactic acid level -IV fluid: 1 L normal saline  UTI (urinary tract infection) -see above  Hypertension -IV hydralazine prn -On Cardizem drip -On metoprolol -Hold home propranolol-HCTZ  Hyperlipidemia - Pravastatin  Nausea & vomiting Likely due to UTI.  Lipase normal. -As needed Zofran -IV  fluid  Pre-diabetes - Follow-up A1c  Hypokalemia Potassium 3.4.  Magnesium 2.0 -Repleted potassium          DVT ppx: on IV heparin  Code Status: Full code  Family Communication:  Yes, patient's son and husband  at bed side.    Disposition Plan:  Anticipate discharge back to previous environment  Consults called:  Dr. Mariah Milling of card is consulted.  Admission status and Level of care: Progressive:  for obs     Dispo: The patient is from: Home  Anticipated d/c is to: Home              Anticipated d/c date is: 1 day              Patient currently is not medically stable to d/c.    Severity of Illness:  The appropriate patient status for this patient is OBSERVATION. Observation status is judged to be reasonable and necessary in order to provide the required intensity of service to ensure the patient's safety. The patient's presenting symptoms, physical exam findings, and initial radiographic and laboratory data in the context of their medical condition is felt to place them at decreased risk for further clinical deterioration. Furthermore, it is anticipated that the patient will be medically stable for discharge from the hospital within 2 midnights of admission.        Date of Service 07/03/2022    Lorretta Harp [facility]   If 7PM-7AM, please contact night-coverage [email or web address] 07/03/2022, 6:21 PM

## 2022-07-03 NOTE — Assessment & Plan Note (Signed)
-  IV hydralazine prn -On Cardizem drip -On metoprolol -Hold home propranolol-HCTZ

## 2022-07-03 NOTE — Assessment & Plan Note (Signed)
Likely due to UTI.  Lipase normal. -As needed Zofran -IV fluid

## 2022-07-03 NOTE — Consult Note (Signed)
ANTICOAGULATION CONSULT NOTE - Initial Consult  Pharmacy Consult for heparin infusion Indication: atrial fibrillation  Allergies  Allergen Reactions   Allopurinol Shortness Of Breath   Levofloxacin Shortness Of Breath    Chest pressure   Ampicillin Itching   Celebrex [Celecoxib] Hives   Nexium [Esomeprazole Magnesium] Hives   Ranitidine    Zyrtec [Cetirizine] Other (See Comments)    Feel ill    Patient Measurements:   Heparin Dosing Weight: 63.9kg  Vital Signs: Temp: 98.2 F (36.8 C) (08/25 1642) Temp Source: Oral (08/25 1239) BP: 125/75 (08/25 1630) Pulse Rate: 80 (08/25 1635)  Labs: Recent Labs    07/03/22 0816 07/03/22 1025  HGB 14.2  --   HCT 42.5  --   PLT 228  --   CREATININE 0.84  --   TROPONINIHS 5 10    CrCl cannot be calculated (Unknown ideal weight.).   Medical History: Past Medical History:  Diagnosis Date   Arthritis    Cataract    GERD (gastroesophageal reflux disease)    Hyperlipidemia    Hypertension    PAF (paroxysmal atrial fibrillation) (HCC)    Pancreatitis    Pre-diabetes     Medications:  PTA: ASA 81mg  QD  Inpatient: Heparin infusion (8/25 >>) Allergies: No AC or APT related allergies  Assessment: 81 y.o. female with medical history significant of HTN, HLD, pre-DM, GERD, pancreatitis, PAF not on anticoagulants, who presents with nausea and vomiting. Pharmacy consulted for heparin infusion in the setting of atrial fibrillation  Goal of Therapy:  Heparin level 0.3-0.7 units/ml Monitor platelets by anticoagulation protocol: Yes   Date Time aPTT/HL Rate/Comment       Plan:  STAT aPTT Give 3200 units bolus x1; then start heparin infusion at 1000 units/hr Check anti-Xa level in 8 hours and daily once consecutively therapeutic. Continue to monitor H&H and platelets daily while on heparin gtt.       96 07/03/2022,4:58 PM

## 2022-07-03 NOTE — Assessment & Plan Note (Addendum)
Sepsis due to UTI: Patient admits criteria for sepsis with heart rate 140s and RR 29.  No fever or leukocytosis. -IV Rocephin - f/u Urine culture -Check procalcitonin level and lactic acid level -IV fluid: 1 L normal saline

## 2022-07-03 NOTE — ED Notes (Signed)
Pt given broth and diet ginger ale per MD/pt request

## 2022-07-03 NOTE — Consult Note (Signed)
Cardiology Consultation:   Patient ID: Charlene Norris; [id number]; [birth year]   Admit date: 07/03/2022 Date of Consult: 07/03/2022  Primary Care Provider: Alan Mulder, MD Primary Cardiologist: New to [facility] - consult by [facility] Primary Electrophysiologist:  None   Patient Profile:   Charlene Norris is a 81 y.o. female with a hx of A-fib not on anticoagulation, pancreatitis, hiatal hernia, HTN, HLD, prediabetes, and GERD who is being seen today for the evaluation of Afib with RVR at the request of Dr. Clyde Lundborg.  History of Present Illness:   Remote EKG from 2010 shows A-fib with RVR.  She has not been maintained on anticoagulation.  EKG from 2016 showed sinus rhythm.  She reports regular cardiac testing through her PCPs office including echocardiogram.  We do not have these available for review.  On the morning of 07/03/2022, she got up to have a BM and was straining.  This was associated with dizziness and sweating.  She got up to lay on the bed, rolled over, and suffered a syncopal episode.  This was not witnessed, however her husband did hear the episode.  She came to on the floor.  EMS was called where she was found to be in A-fib with RVR.  She denied any preceding chest pain or dyspnea.  She did note ongoing dizziness and unsteadiness.  In the ED, she has had greater than 10 episodes of nonbilious, nonbloody emesis.   BP stable, afebrile, oxygen saturation 100% on room air.  Presenting rhythm of A-fib with RVR in the 140s to 150s bpm.  Telemetry does show several episodes of prolonged RR intervals of 4.7 to 4.8 seconds in the ED around 8:30 AM.  It is unclear if this timeframe corresponds with possible emesis.  She was given Zofran just prior to these episodes.  High-sensitivity troponin normal x2.  CBC unremarkable.  Potassium 3.4, magnesium 2.0, normal renal and liver function, normal lipase, and COVID negative.  UA consistent with UTI.  Head CT showed no acute intracranial abnormality with  low attenuation of the periventricular white matter presumed to be chronic microvascular ischemic changes.  MRI of the brain showed no evidence of recent infarction, hemorrhage, or mass with chronic microvascular ischemic changes.  In the ED, she was given IV Rocephin, IV fluids, potassium, Zofran, Reglan, 10 mg of IV Cardizem, and placed on a diltiazem drip.  She remains in A-fib with ventricular rates ranging from the 90s to 140s bpm.  She is without symptoms of angina or dyspnea.  No further dizziness.  Emesis has improved.   Past Medical History:  Diagnosis Date   Arthritis    Cataract    GERD (gastroesophageal reflux disease)    Hyperlipidemia    Hypertension    PAF (paroxysmal atrial fibrillation) (HCC)    Pancreatitis    Pre-diabetes     Past Surgical History:  Procedure Laterality Date   ABDOMINAL HYSTERECTOMY     partial   CHOLECYSTECTOMY N/A 07/24/2015   Procedure: LAPAROSCOPIC CHOLECYSTECTOMY CONVERTED TO OPEN CHOLECYSTECTOMY ;  Surgeon: Kieth Brightly, MD;  Location: [facility] ORS;  Service: General;  Laterality: N/A;   ERCP N/A 12/30/2021   Procedure: ENDOSCOPIC RETROGRADE CHOLANGIOPANCREATOGRAPHY (ERCP);  Surgeon: Midge Minium, MD;  Location: [facility] ENDOSCOPY;  Service: Endoscopy;  Laterality: N/A;   EXPLORATORY LAPAROTOMY     JOINT REPLACEMENT Bilateral 2009, 2010   knee   PARATHYROID EXPLORATION  2012   growth/ Dr Willeen Cass   PARTIAL HYSTERECTOMY  Home Meds: Prior to Admission medications   Medication Sig Start Date End Date Taking? Authorizing Provider  aspirin 325 MG tablet Take 325 mg by mouth daily. Patient not taking: Reported on 12/30/2021    [provider]  aspirin EC 81 MG tablet Take 81 mg by mouth daily. Swallow whole.    [provider]  clidinium-chlordiazePOXIDE (LIBRAX) 5-2.5 MG per capsule TK 1 C PO QD PRN Patient not taking: Reported on 12/23/2021 05/16/15   [provider]  glucosamine-chondroitin 500-400 MG tablet Take  1 tablet by mouth daily.  Patient not taking: Reported on 12/23/2021    [provider]  lovastatin (MEVACOR) 40 MG tablet TK 1 T PO QEVENING 06/10/15   [provider]  Omega-3 Fatty Acids (FISH OIL) 1200 MG CAPS Take by mouth daily. Patient not taking: Reported on 12/23/2021    [provider]  propranolol-hydrochlorothiazide (INDERIDE) 40-25 MG per tablet TK 1 T PO QAM 06/12/15   [provider]  vitamin B-12 (CYANOCOBALAMIN) 1000 MCG tablet Take 1,000 mcg by mouth daily. Patient not taking: Reported on 12/23/2021    [provider]  vitamin E 400 UNIT capsule Take 400 Units by mouth daily. Patient not taking: Reported on 12/23/2021    [provider]    Inpatient Medications: Scheduled Meds:  enoxaparin (LOVENOX) injection  40 mg Subcutaneous Q24H   Continuous Infusions:  cefTRIAXone (ROCEPHIN)  IV Stopped (07/03/22 1536)   diltiazem (CARDIZEM) infusion 15 mg/hr (07/03/22 1511)   PRN Meds: acetaminophen, hydrALAZINE, ondansetron (ZOFRAN) IV  Allergies:   Allergies  Allergen Reactions   Allopurinol Shortness Of Breath   Levofloxacin Shortness Of Breath    Chest pressure   Ampicillin Itching   Celebrex [Celecoxib] Hives   Nexium [Esomeprazole Magnesium] Hives   Ranitidine    Zyrtec [Cetirizine] Other (See Comments)    Feel ill    Social History:   Social History   Socioeconomic History   Marital status: Married    Spouse name: Not on file   Number of children: Not on file   Years of education: Not on file   Highest education level: Not on file  Occupational History   Not on file  Tobacco Use   Smoking status: Former    Years: 5.00    Types: Cigarettes    Quit date: 11/09/1968    Years since quitting: 53.6   Smokeless tobacco: Never  Vaping Use   Vaping Use: Never used  Substance and Sexual Activity   Alcohol use: No    Alcohol/week: 0.0 standard drinks of alcohol   Drug use: No   Sexual activity: Not on file   Other Topics Concern   Not on file  Social History Narrative   Not on file   Social Determinants of Health   Financial Resource Strain: Not on file  Food Insecurity: Not on file  Transportation Needs: Not on file  Physical Activity: Not on file  Stress: Not on file  Social Connections: Not on file  Intimate Partner Violence: Not on file     Family History:   Family History  Problem Relation Age of Onset   Parkinson's disease Mother    Diabetes Father    Hypertension Father    Breast cancer Sister 11       DCIS    ROS:  Review of Systems  Constitutional:  Positive for malaise/fatigue. Negative for chills, diaphoresis, fever and weight loss.  HENT:  Negative for congestion.   Eyes:  Negative for discharge and redness.  Respiratory:  Negative for cough, hemoptysis, sputum production, shortness of breath and wheezing.   Cardiovascular:  Negative for chest pain, palpitations, orthopnea, claudication, leg swelling and PND.  Gastrointestinal:  Positive for abdominal pain, constipation, nausea and vomiting. Negative for blood in stool, diarrhea, heartburn and melena.  Genitourinary:  Negative for hematuria.  Musculoskeletal:  Negative for falls and myalgias.  Skin:  Negative for rash.  Neurological:  Positive for loss of consciousness and weakness. Negative for dizziness, tingling, tremors, sensory change, speech change and focal weakness.  Endo/Heme/Allergies:  Does not bruise/bleed easily.  Psychiatric/Behavioral:  Negative for substance abuse. The patient is not nervous/anxious.   All other systems reviewed and are negative.     Physical Exam/Data:   Vitals:   07/03/22 1536 07/03/22 1537 07/03/22 1600 07/03/22 1606  BP:   (!) 135/116   Pulse: 80 93 81 95  Resp: 18 17 13 16   Temp:      TempSrc:      SpO2: 97% 97% 100% 99%   No intake or output data in the 24 hours ending 07/03/22 1627 There were no vitals filed for this visit. There is no height or weight on file  to calculate BMI.   Physical Exam: General: Well developed, well nourished, in no acute distress. Head: Normocephalic, atraumatic, sclera non-icteric, no xanthomas, nares without discharge.  Neck: Negative for carotid bruits. JVD not elevated. Lungs: Clear bilaterally to auscultation without wheezes, rales, or rhonchi. Breathing is unlabored. Heart: Tachycardic, IRIR with S1 S2. No murmurs, rubs, or gallops appreciated. Abdomen: Soft, non-tender, non-distended with normoactive bowel sounds. No hepatomegaly. No rebound/guarding. No obvious abdominal masses. Msk:  Strength and tone appear normal for age. Extremities: No clubbing or cyanosis. No edema. Distal pedal pulses are 2+ and equal bilaterally. Neuro: Alert and oriented X 3. No facial asymmetry. No focal deficit. Moves all extremities spontaneously. Psych:  Responds to questions appropriately with a normal affect.   EKG:  The EKG was personally reviewed and demonstrates: Afib with RVR, 149 bpm, nonspecific inferior st/t changes Telemetry:  Telemetry was personally reviewed and demonstrates: Afib with ventricular rates ranging from the 90s to 140s bpm, 2 episodes of prolonged RR interval around 8:30 AM ranging from 4.7 to 4.8 seconds  Weights: There were no vitals filed for this visit.  Relevant CV Studies:  None  Laboratory Data:  Chemistry Recent Labs  Lab 07/03/22 0816  NA 139  K 3.4*  CL 105  CO2 23  GLUCOSE 160*  BUN 18  CREATININE 0.84  CALCIUM 10.1  GFRNONAA >60  ANIONGAP 11    Recent Labs  Lab 07/03/22 0816  PROT 7.2  ALBUMIN 4.2  AST 24  ALT 16  ALKPHOS 69  BILITOT 0.8   Hematology Recent Labs  Lab 07/03/22 0816  WBC 7.6  RBC 5.03  HGB 14.2  HCT 42.5  MCV 84.5  MCH 28.2  MCHC 33.4  RDW 13.3  PLT 228   Cardiac EnzymesNo results for input(s): "TROPONINI" in the last 168 hours. No results for input(s): "TROPIPOC" in the last 168 hours.  BNPNo results for input(s): "BNP", "PROBNP" in the last  168 hours.  DDimer No results for input(s): "DDIMER" in the last 168 hours.  Radiology/Studies:  MR BRAIN WO CONTRAST  Result Date: 07/03/2022 IMPRESSION: No evidence of recent infarction, hemorrhage, or mass. Chronic microvascular ischemic changes. Electronically Signed   By: 07/05/2022 M.D.   On: 07/03/2022 12:19   CT  HEAD WO CONTRAST ( )  Result Date: 07/03/2022 IMPRESSION: 1. No acute intracranial abnormality. 2. Low-attenuation of the periventricular white matter presumed chronic microvascular ischemic changes. Electronically Signed   By: Larose Hires D.O.   On: 07/03/2022 09:05    Assessment and Plan:   1. PAF:  -Patient previously documented to have history of A-fib by EKG in 2010, not maintained on anticoagulation  -Admitted to [facility] with sepsis secondary to UTI and A-fib with RVR  -Ventricular rates should improve with improvement in the patient's sepsis/UTI  -Uncertain chronicity  -Continue rate control with diltiazem drip for now with recommendation to transition to oral rate control as able  -Add metoprolol 12.5 mg every 6 hours for added rate control -Obtain echo when ventricular rate improves  -CHA2DS2-VASc at least 4 (HTN, age x2, sex category)  -Discontinue DVT dose Lovenox, start heparin drip with recommendation to initiate OAC prior to discharge  -If ventricular rates remain difficult to control we may need to consider TEE guided DCCV prior to discharge, otherwise could consider DCCV as an outpatient following 4 weeks of adequate and uninterrupted anticoagulation -Check TSH  -Replete potassium to goal 4.0   2.  Syncope: -Possibly vasovagal related to straining for BM -Noted to have 2 episodes of prolonged RR intervals on telemetry in the ED ranging from 4.7 to 4.8 seconds, it is unclear if this was in the setting of emesis -She did not suffer LOC in the ED -Monitor on telemetry -May benefit from Zio patch at discharge  3. Hypokalemia:  -Replete to goal 4.0    4. Sepsis secondary to UTI:  -Likely driving A-fib with RVR  -Management per primary service     For questions or updates, please contact [facility] Please consult [email or web address] for contact info under Cardiology/STEMI.   Signed, Eula Listen, PA-C [facility] Pager: [phone] 07/03/2022, 4:27 PM

## 2022-07-03 NOTE — Assessment & Plan Note (Signed)
Pravastatin  

## 2022-07-03 NOTE — Assessment & Plan Note (Signed)
Follow-up A1c

## 2022-07-03 NOTE — ED Triage Notes (Signed)
Pt presents to ED with c/o of having a syncopal episode today while she was on the toilet. Pt also c/o of N/V. Pt actively vomiting at this time.   Pt states she has recently started taking chamomile and lavender tea to sleep, pt states she started this 1 week ago.  Pt states HX of pancreatis, pt states he feels a sharp pain in that area. Pt denies drinking in the past 50 years.   Pt is from home. Pt denies fevers and chills. Pt states s/s started this morning.

## 2022-07-03 NOTE — Assessment & Plan Note (Signed)
Potassium 3.4.  Magnesium 2.0 -Repleted potassium

## 2022-07-03 NOTE — ED Provider Notes (Signed)
[facility] Provider Note    Event Date/Time   First MD Initiated Contact with Patient 07/03/22 [phone]     (approximate)  History   Chief Complaint: Abdominal Pain  HPI  Charlene Norris is a 81 y.o. female with a past medical history of arthritis, hypertension, hyperlipidemia, presents to the emergency department for nausea vomiting.  According to the patient she was on the toilet this morning, states she was straining somewhat when all of a sudden she became very dizzy nauseated with vomiting.  Has continued to have nausea vomiting states a very slight discomfort at times in the upper abdomen although denies any currently.  Denies any recent fever cough or congestion, no dysuria no diarrhea.  Does state a history of pancreatitis but this feels different.  Physical Exam   Triage Vital Signs: ED Triage Vitals  Enc Vitals Group     BP      Pulse      Resp      Temp      Temp src      SpO2      Weight      Height      Head Circumference      Peak Flow      Pain Score      Pain Loc      Pain Edu?      Excl. in GC?     Most recent vital signs: There were no vitals filed for this visit.  General: Awake, no distress.  CV:  Good peripheral perfusion.  Irregular rhythm rate around 150 bpm. Resp:  Normal effort.  Equal breath sounds bilaterally.  Abd:  No distention.  Soft, nontender.  No rebound or guarding.   ED Results / Procedures / Treatments   EKG  EKG viewed and interpreted by myself shows atrial fibrillation at 152 bpm with a narrow QRS, normal axis, normal intervals besides slight QTc prolongation nonspecific ST changes.  RADIOLOGY  I have reviewed and interpreted the CT head images I do not see any large bleed on my evaluation. Radiology is read the CT scan is negative. Radiology is read the MRI is negative for acute abnormality.   MEDICATIONS ORDERED IN ED: Medications  ondansetron (ZOFRAN) injection 4 mg (has no administration in  time range)  sodium chloride 0.9 % bolus 1,000 mL (has no administration in time range)     IMPRESSION / MDM / ASSESSMENT AND PLAN / ED COURSE  I reviewed the triage vital signs and the nursing notes.  Patient's presentation is most consistent with acute presentation with potential threat to life or bodily function.  Patient presents emergency department for nausea vomiting that is started abruptly at 630 this morning while the patient was straining on the toilet.  Patient states while straining she became lightheaded and weak followed by nausea and vomiting.  Denies any headache.  Denies any focal weakness or numbness.  Largely negative review of systems.  Benign abdomen currently with no tenderness.  Given the patient's nausea we will treat with Zofran and IV fluids.  We will check labs including LFTs and lipase.  We will obtain a COVID swab.  Given the patient's acute onset of vomiting following straining on the toilet I have also added on a troponin as well as a CT scan of the head although patient denies any headache.  We will continue to closely monitor while treating the patient's symptoms and awaiting lab results.  Patient agreeable to  plan of care.  Patient's work-up shows an equivocal urinalysis, negative COVID, negative troponin, normal CBC, reassuring chemistry, negative lipase.  Patient CT scan shows no concerning abnormality.  Patient appears to be in atrial fibrillation which appears to be new for the patient per record review.  Given the patient's atrial fibrillation dizziness even though CT scan of the head is normal we will proceed with an MRI to rule out CVA.  MRI is negative for CVA.  Patient continues to be in A-fib with RVR despite IV diltiazem.  We will start on a diltiazem infusion and admit to the hospitalist service for ongoing treatment and work-up.  Patient states the nausea is better but still present.  CRITICAL CARE Performed by: Minna Antis   Total critical  care time: 30 minutes  Critical care time was exclusive of separately billable procedures and treating other patients.  Critical care was necessary to treat or prevent imminent or life-threatening deterioration.  Critical care was time spent personally by me on the following activities: development of treatment plan with patient and/or surrogate as well as nursing, discussions with consultants, evaluation of patient's response to treatment, examination of patient, obtaining history from patient or surrogate, ordering and performing treatments and interventions, ordering and review of laboratory studies, ordering and review of radiographic studies, pulse oximetry and re-evaluation of patient's condition.   FINAL CLINICAL IMPRESSION(S) / ED DIAGNOSES   Nausea vomiting Atrial fibrillation with rapid ventricular response    Note:  This document was prepared using Dragon voice recognition software and may include unintentional dictation errors.   Minna Antis, MD 07/03/22 1329

## 2022-07-03 NOTE — Assessment & Plan Note (Signed)
HR is up to 140s. Started Cardizem drip in ED.  Consulted Dr. Mariah Milling of cardiology. CHA2DS2-VASc at least 4, will need chronic AC.   -place in PCU for obs - IV heparin per cardiac recommendation -Continue Cardizem drip -Added metoprolol 12.5 mg every 6 hours by cardiologist -Check TSH --> 2.012

## 2022-07-04 DIAGNOSIS — E7800 Pure hypercholesterolemia, unspecified: Secondary | ICD-10-CM | POA: Diagnosis not present

## 2022-07-04 DIAGNOSIS — I4891 Unspecified atrial fibrillation: Secondary | ICD-10-CM | POA: Diagnosis not present

## 2022-07-04 LAB — HEPARIN LEVEL (UNFRACTIONATED): Heparin Unfractionated: >1.1 IU/mL — ABNORMAL HIGH (ref 0.30–0.70)

## 2022-07-04 MED ORDER — METOPROLOL SUCCINATE ER 25 MG PO TB24: 12.5000 mg | ORAL_TABLET | Freq: Every day | ORAL | 1 refills | Status: DC

## 2022-07-04 MED ORDER — HEPARIN (PORCINE) 25000 UT/250ML-% IV SOLN
800.0000 [IU]/h | INTRAVENOUS | Status: DC
  Administered 2022-07-04: 800 [IU]/h via INTRAVENOUS

## 2022-07-04 MED ORDER — APIXABAN 5 MG PO TABS: 5.0000 mg | ORAL_TABLET | Freq: Two times a day (BID) | ORAL | 1 refills | Status: DC

## 2022-07-04 MED ORDER — CEPHALEXIN 500 MG PO CAPS: 500.0000 mg | ORAL_CAPSULE | Freq: Two times a day (BID) | ORAL | 0 refills | Status: AC

## 2022-07-04 MED ORDER — METOPROLOL SUCCINATE ER 25 MG PO TB24
12.5000 mg | ORAL_TABLET | Freq: Every day | ORAL | Status: DC
  Administered 2022-07-04: 12.5 mg via ORAL
  Filled 2022-07-04: qty 0.5

## 2022-07-04 MED ORDER — APIXABAN 5 MG PO TABS
5.0000 mg | ORAL_TABLET | Freq: Two times a day (BID) | ORAL | Status: DC
  Administered 2022-07-04: 5 mg via ORAL
  Filled 2022-07-04: qty 1

## 2022-07-04 MED ORDER — CEPHALEXIN 500 MG PO CAPS
500.0000 mg | ORAL_CAPSULE | Freq: Two times a day (BID) | ORAL | Status: DC
  Administered 2022-07-04: 500 mg via ORAL
  Filled 2022-07-04: qty 1

## 2022-07-04 NOTE — Discharge Summary (Signed)
Physician Discharge Summary   Patient: Charlene Norris MRN: [id number] DOB: 1941-01-07  Admit date:     07/03/2022  Discharge date: 07/04/22  Discharge Physician: Enedina Finner   PCP: Alan Mulder, MD   Recommendations at discharge:   follow-up cardiology Dr. Azucena Cecil in 1 to 2 weeks patient to get zoll monitor via cardiology office in the mail. Follow instructions and apply monitor follow-up PCP in 1 to 2 weeks  Discharge Diagnoses: Paroxysmal atrial fibrillation syncope possible vasovagal  Hospital Course:  Charlene Norris is a 81 y.o. female with medical history significant of HTN, HLD, pre-DM, GERD, pancreatitis, PAF not on anticoagulants, who presents with nausea and vomiting. Patient has history of constipation was doing well however for last few days has been feeling constipated was trying to strain to have BM and had a syncopal episode. She did not lose consciousness. Patient was found to have heart rate in the 140s with tachypnea.  A fib with RVR history of paroxysmal a fib -- came in with elevated heart rate after straining for BM with possible syncopal episode -- did not lose consciousness -- received IV Cardizem and was placed on IV heparin drip -- seen by cardiology [facility] -- recommends Zoll monitor-- will be mailed -- echo of the heart as outpatient since echo tech not available-- Toprol-XL 12.5 mg daily, start eliquis 5 mg BID per cardiology recommendation -- patient currently in sinus rhythm heart rate in the 60s denies any complaints -- patient is otherwise functional and independent at home  Sepsis suspected due to UTI -- complains of mild burning in the urine --received IV is Rocephin-- change to PO Keflex -- sepsis resolved  Hypertension -- resume lisinopril -- now on metoprolol  Hyperlipidemia -- continue statins  Discharge plan discussed with patient and husband. Will discharged to home with outpatient follow-up cardiology    Pain control  - [facility] Controlled Substance Reporting System database was reviewed. and patient was instructed, not to drive, operate heavy machinery, perform activities at heights, swimming or participation in water activities or provide baby-sitting services while on Pain, Sleep and Anxiety Medications; until their outpatient Physician has advised to do so again. Also recommended to not to take more than prescribed Pain, Sleep and Anxiety Medications.  Consultants: [facility] cardiology Disposition: Home Diet recommendation:  Discharge Diet Orders (From admission, onward)     Start     Ordered   07/04/22 0000  Diet - low sodium heart healthy        07/04/22 1428           Cardiac diet DISCHARGE MEDICATION: Allergies as of 07/04/2022       Reactions   Allopurinol Shortness Of Breath   Levofloxacin Shortness Of Breath   Chest pressure   Ampicillin Itching   Celebrex [celecoxib] Hives   Nexium [esomeprazole Magnesium] Hives   Ranitidine    Zyrtec [cetirizine] Other (See Comments)   Feel ill        Medication List     TAKE these medications    allopurinol 100 MG tablet Commonly known as: ZYLOPRIM Take 100 mg by mouth daily.   apixaban 5 MG Tabs tablet Commonly known as: ELIQUIS Take 1 tablet (5 mg total) by mouth 2 (two) times daily.   aspirin EC 81 MG tablet Take 81 mg by mouth daily. Swallow whole.   cephALEXin 500 MG capsule Commonly known as: KEFLEX Take 1 capsule (500 mg total) by mouth every 12 (twelve) hours for  4 days.   lisinopril 10 MG tablet Commonly known as: ZESTRIL Take 10 mg by mouth daily.   lovastatin 40 MG tablet Commonly known as: MEVACOR TK 1 T PO QEVENING   metoprolol succinate 25 MG 24 hr tablet Commonly known as: TOPROL-XL Take 0.5 tablets (12.5 mg total) by mouth daily. Start taking on: July 05, 2022   Vitamin D (Ergocalciferol) 1.25 MG (50000 UNIT) Caps capsule Commonly known as: DRISDOL Take 50,000 Units by mouth 2 (two) times a week.         Follow-up Information     Morayati, Delsa Sale, MD. Schedule an appointment as soon as possible for a visit in 1 week(s).   Specialty: Endocrinology Contact information: [address] [place] [address] [phone]         Debbe Odea, MD. Schedule an appointment as soon as possible for a visit in 1 week(s).   Specialties: Cardiology, Radiology Why: afib now on po eliquis Contact information: [address] [place] [address] [phone]                Discharge Exam: Charlene Norris Weights   07/03/22 1658  Weight: 73.5 kg     Condition at discharge: fair  The results of significant diagnostics from this hospitalization (including imaging, microbiology, ancillary and laboratory) are listed below for reference.   Imaging Studies: MR BRAIN WO CONTRAST  Result Date: 07/03/2022 CLINICAL DATA:  Dizziness, non-specific EXAM: MRI HEAD WITHOUT CONTRAST TECHNIQUE: Multiplanar, multiecho pulse sequences of the brain and surrounding structures were obtained without intravenous contrast. COMPARISON:  None Available. FINDINGS: Brain: There is no acute infarction or intracranial hemorrhage. There is no intracranial mass, mass effect, or edema. There is no hydrocephalus or extra-axial fluid collection. Ventricles and sulci are within normal limits in size and configuration. Patchy and confluent areas of T2 hyperintensity in the supratentorial white matter are nonspecific but may reflect mild to moderate chronic microvascular ischemic changes. Vascular: Major vessel flow voids at the skull base are preserved. Skull and upper cervical spine: Normal marrow signal is preserved. Sinuses/Orbits: Trace mucosal thickening.  Orbits are unremarkable. Other: Sella is unremarkable.  Mastoid air cells are clear. IMPRESSION: No evidence of recent infarction, hemorrhage, or mass. Chronic microvascular ischemic changes. Electronically Signed   By: Guadlupe Spanish M.D.   On: 07/03/2022  12:19   CT HEAD WO CONTRAST ( )  Result Date: 07/03/2022 CLINICAL DATA:  Dizziness, nonspecific. EXAM: CT HEAD WITHOUT CONTRAST TECHNIQUE: Contiguous axial images were obtained from the base of the skull through the vertex without intravenous contrast. RADIATION DOSE REDUCTION: This exam was performed according to the departmental dose-optimization program which includes automated exposure control, adjustment of the mA and/or kV according to patient size and/or use of iterative reconstruction technique. COMPARISON:  None Available. FINDINGS: Brain: No evidence of acute infarction, hemorrhage, hydrocephalus, extra-axial collection or mass lesion/mass effect. Low-attenuation of the periventricular white matter presumed chronic microvascular ischemic changes. Vascular: No hyperdense vessel or unexpected calcification. Skull: Normal. Negative for fracture or focal lesion. Sinuses/Orbits: No acute finding. Other: None. IMPRESSION: 1. No acute intracranial abnormality. 2. Low-attenuation of the periventricular white matter presumed chronic microvascular ischemic changes. Electronically Signed   By: Larose Hires D.O.   On: 07/03/2022 09:05    Microbiology: Results for orders placed or performed during the hospital encounter of 07/03/22  SARS Coronavirus 2 by RT PCR (hospital order, performed in [facility] hospital lab) *cepheid single result test* Anterior Nasal Swab     Status:  None   Collection Time: 07/03/22 10:57 AM   Specimen: Anterior Nasal Swab  Result Value Ref Range Status   SARS Coronavirus 2 by RT PCR NEGATIVE NEGATIVE Final    Comment: (NOTE) SARS-CoV-2 target nucleic acids are NOT DETECTED.  The SARS-CoV-2 RNA is generally detectable in upper and lower respiratory specimens during the acute phase of infection. The lowest concentration of SARS-CoV-2 viral copies this assay can detect is 250 copies / mL. A negative result does not preclude SARS-CoV-2 infection and should not be used as the  sole basis for treatment or other patient management decisions.  A negative result may occur with improper specimen collection / handling, submission of specimen other than nasopharyngeal swab, presence of viral mutation(s) within the areas targeted by this assay, and inadequate number of viral copies (<250 copies / mL). A negative result must be combined with clinical observations, patient history, and epidemiological information.  Fact Sheet for Patients:   RoadLapTop.co.za  Fact Sheet for Healthcare Providers: http://kim-miller.com/  This test is not yet approved or  cleared by the [place] FDA and has been authorized for detection and/or diagnosis of SARS-CoV-2 by FDA under an Emergency Use Authorization (EUA).  This EUA will remain in effect (meaning this test can be used) for the duration of the COVID-19 declaration under Section 564(b)(1) of the Act, 21 U.S.C. section 360bbb-3(b)(1), unless the authorization is terminated or revoked sooner.  Performed at [facility], [address] [place], [place] [address]     Labs: CBC: Recent Labs  Lab 07/03/22 0816  WBC 7.6  HGB 14.2  HCT 42.5  MCV 84.5  PLT 228   Basic Metabolic Panel: Recent Labs  Lab 07/03/22 0816 07/03/22 1025  NA 139  --   K 3.4*  --   CL 105  --   CO2 23  --   GLUCOSE 160*  --   BUN 18  --   CREATININE 0.84  --   CALCIUM 10.1  --   MG  --  2.0   Liver Function Tests: Recent Labs  Lab 07/03/22 0816  AST 24  ALT 16  ALKPHOS 69  BILITOT 0.8  PROT 7.2  ALBUMIN 4.2    Discharge time spent: greater than 30 minutes.  Signed: Enedina Finner, MD [facility] 07/04/2022

## 2022-07-04 NOTE — Consult Note (Signed)
ANTICOAGULATION CONSULT NOTE - Initial Consult  Pharmacy Consult for heparin infusion Indication: atrial fibrillation  Allergies  Allergen Reactions   Allopurinol Shortness Of Breath   Levofloxacin Shortness Of Breath    Chest pressure   Ampicillin Itching   Celebrex [Celecoxib] Hives   Nexium [Esomeprazole Magnesium] Hives   Ranitidine    Zyrtec [Cetirizine] Other (See Comments)    Feel ill    Patient Measurements: Height: 5\' 1"  (154.9 cm) Weight: 73.5 kg (162 lb) IBW/kg (Calculated) : 47.8 Heparin Dosing Weight: 63.9kg  Vital Signs: Temp: 98 F (36.7 C) (08/26 0200) Temp Source: Oral (08/26 0200) BP: 112/50 (08/26 0300) Pulse Rate: 54 (08/26 0300)  Labs: Recent Labs    07/03/22 0816 07/03/22 1025 07/03/22 1708 07/04/22 0236  HGB 14.2  --   --   --   HCT 42.5  --   --   --   PLT 228  --   --   --   APTT  --   --  32  --   HEPARINUNFRC  --   --   --  >1.10*  CREATININE 0.84  --   --   --   TROPONINIHS 5 10  --   --      Estimated Creatinine Clearance: 49 mL/min (by C-G formula based on SCr of 0.84 mg/dL).   Medical History: Past Medical History:  Diagnosis Date   Arthritis    Cataract    GERD (gastroesophageal reflux disease)    Hyperlipidemia    Hypertension    PAF (paroxysmal atrial fibrillation) (HCC)    Pancreatitis    Pre-diabetes     Medications:  PTA: ASA 81mg  QD  Inpatient: Heparin infusion (8/25 >>) Allergies: No AC or APT related allergies  Assessment: 81 y.o. female with medical history significant of HTN, HLD, pre-DM, GERD, pancreatitis, PAF not on anticoagulants, who presents with nausea and vomiting. Pharmacy consulted for heparin infusion in the setting of atrial fibrillation  Goal of Therapy:  Heparin level 0.3-0.7 units/ml Monitor platelets by anticoagulation protocol: Yes   Date Time aPTT/HL Rate/Comment 8/26 0236 HL > 1.1 Supratherapeutic    Plan:  96.  Hold infusion for 1 hour. Restart Heparin infusion at  800 units/hr Recheck HL in 8 hr after restart. CBC daily while on heparin  9/26, PharmD, [facility] 07/04/2022 3:31 AM

## 2022-07-04 NOTE — Progress Notes (Signed)
Progress Note  Patient Name: Charlene Norris Date of Encounter: 07/04/2022  Primary Cardiologist: New to [facility] - consult by Mariah Milling  Subjective   She converted to sinus rhythm off telemetry and is maintaining sinus with heart rates in the upper 50s to mid 60s bpm. No chest pain, dyspnea, palpitations, dizziness, presyncope, or syncope. No further pauses noted on tele. Some nausea without emesis following breakfast. Husband, son, and grandson at bedside.   Inpatient Medications    Scheduled Meds:  aspirin EC  81 mg Oral Daily   metoprolol tartrate  12.5 mg Oral Q6H   pravastatin  40 mg Oral q1800   Continuous Infusions:  cefTRIAXone (ROCEPHIN)  IV Stopped (07/03/22 1536)   diltiazem (CARDIZEM) infusion Stopped (07/04/22 0400)   heparin 800 Units/hr (07/04/22 0508)   PRN Meds: acetaminophen, hydrALAZINE, ondansetron (ZOFRAN) IV   Vital Signs    Vitals:   07/04/22 0530 07/04/22 0630 07/04/22 0700 07/04/22 0924  BP: (!) 125/41 (!) 125/47 (!) 128/43   Pulse: (!) 51 (!) 56 63 (!) 58  Resp: 18 18 17    Temp:      TempSrc:      SpO2: 99% 98% 100%   Weight:      Height:       No intake or output data in the 24 hours ending 07/04/22 0930 Filed Weights   07/03/22 1658  Weight: 73.5 kg    Telemetry    Converted to sinus rhythm while off tele, currently in sinus rhythm with heart rates in the upper 50s to mid 60s bpm, rare PVCs - Personally Reviewed  ECG    No new tracings - Personally Reviewed  Physical Exam   GEN: No acute distress.   Neck: No JVD. Cardiac: RRR, no murmurs, rubs, or gallops.  Respiratory: Clear to auscultation bilaterally.  GI: Soft, nontender, non-distended.   MS: No edema; No deformity. Neuro:  Alert and oriented x 3; Nonfocal.  Psych: Normal affect.  Labs    Chemistry Recent Labs  Lab 07/03/22 0816  NA 139  K 3.4*  CL 105  CO2 23  GLUCOSE 160*  BUN 18  CREATININE 0.84  CALCIUM 10.1  PROT 7.2  ALBUMIN 4.2  AST 24  ALT 16   ALKPHOS 69  BILITOT 0.8  GFRNONAA >60  ANIONGAP 11     Hematology Recent Labs  Lab 07/03/22 0816  WBC 7.6  RBC 5.03  HGB 14.2  HCT 42.5  MCV 84.5  MCH 28.2  MCHC 33.4  RDW 13.3  PLT 228    Cardiac EnzymesNo results for input(s): "TROPONINI" in the last 168 hours. No results for input(s): "TROPIPOC" in the last 168 hours.   BNPNo results for input(s): "BNP", "PROBNP" in the last 168 hours.   DDimer No results for input(s): "DDIMER" in the last 168 hours.   Radiology    MR BRAIN WO CONTRAST  Result Date: 07/03/2022 IMPRESSION: No evidence of recent infarction, hemorrhage, or mass. Chronic microvascular ischemic changes. Electronically Signed   By: 07/05/2022 M.D.   On: 07/03/2022 12:19   CT HEAD WO CONTRAST (07/05/2022)  Result Date: 07/03/2022 IMPRESSION: 1. No acute intracranial abnormality. 2. Low-attenuation of the periventricular white matter presumed chronic microvascular ischemic changes. Electronically Signed   By: 07/05/2022 D.O.   On: 07/03/2022 09:05    Cardiac Studies   2D echo pending  Patient Profile     81 y.o. female with history of A-fib not on anticoagulation, pancreatitis, hiatal  hernia, HTN, HLD, prediabetes, and GERD who is being seen today for the evaluation of Afib with RVR at the request of Dr. Clyde Lundborg.  Assessment & Plan    1. PAF: -Converted to sinus rhythm while off telemetry, maintaining sinus rhythm currently -Diltiazem gtt has been stopped -Defer addition of AV-nodal blocking medication at this time given heart rates in the upper 50s to mid 60s bpm -CHA2DS2-VASc at least 4 (HTN, age x2, sex category)  -Transition from heparin gtt to Eliquis 5 mg bid (does not meet reduced dosing criteria at this time) prior to discharge -Obtain echo  2. Syncope: -Possibly vasovagal related to straining for BM -Noted to have 2 episodes of prolonged RR intervals on telemetry in the ED ranging from 4.7 to 4.8 seconds on 8/25, it is unclear if this was in  the setting of emesis -She did not suffer LOC in the ED -No further pauses noted on tele -Monitor on telemetry -May benefit from Zio patch at discharge -Await echo  3. Hypokalemia: -BMP pending this morning  4. Sepsis secondary to UTI: -Per IM     For questions or updates, please contact [facility] Please consult [email or web address] for contact info under Cardiology/STEMI.    Signed, Eula Listen, PA-C [facility] Pager: [phone] 07/04/2022, 9:30 AM

## 2022-07-05 LAB — URINE CULTURE

## 2022-07-07 ENCOUNTER — Ambulatory Visit: Payer: Medicare HMO | Attending: Physician Assistant | Admitting: Physician Assistant

## 2022-07-07 ENCOUNTER — Encounter: Payer: Self-pay | Admitting: Physician Assistant

## 2022-07-07 ENCOUNTER — Other Ambulatory Visit
Admission: RE | Admit: 2022-07-07 | Discharge: 2022-07-07 | Disposition: A | Discharge status: Home / Self Care | Payer: Medicare HMO | Attending: Physician Assistant | Admitting: Physician Assistant

## 2022-07-07 VITALS — BP 110/68 | HR 63 | Ht 61.0 in | Wt 157.4 lb

## 2022-07-07 DIAGNOSIS — E876 Hypokalemia: Secondary | ICD-10-CM | POA: Diagnosis not present

## 2022-07-07 DIAGNOSIS — R55 Syncope and collapse: Secondary | ICD-10-CM | POA: Diagnosis not present

## 2022-07-07 DIAGNOSIS — Z87898 Personal history of other specified conditions: Secondary | ICD-10-CM | POA: Diagnosis not present

## 2022-07-07 DIAGNOSIS — I480 Paroxysmal atrial fibrillation: Secondary | ICD-10-CM | POA: Diagnosis not present

## 2022-07-07 LAB — CBC
HCT: 44.4 % (ref 36.0–46.0)
Hemoglobin: 14.6 g/dL (ref 12.0–15.0)
MCH: 28 pg (ref 26.0–34.0)
MCHC: 32.9 g/dL (ref 30.0–36.0)
MCV: 85.2 fL (ref 80.0–100.0)
Platelets: 232 10*3/uL (ref 150–400)
RBC: 5.21 MIL/uL — ABNORMAL HIGH (ref 3.87–5.11)
RDW: 13.2 % (ref 11.5–15.5)
WBC: 8.7 10*3/uL (ref 4.0–10.5)
nRBC: 0 % (ref 0.0–0.2)

## 2022-07-07 LAB — BASIC METABOLIC PANEL
Anion gap: 8 (ref 5–15)
BUN: 15 mg/dL (ref 8–23)
CO2: 24 mmol/L (ref 22–32)
Calcium: 9.9 mg/dL (ref 8.9–10.3)
Chloride: 104 mmol/L (ref 98–111)
Creatinine, Ser: 0.88 mg/dL (ref 0.44–1.00)
GFR, Estimated: >60 mL/min (ref >60)
Glucose, Bld: 110 mg/dL — ABNORMAL HIGH (ref 70–99)
Potassium: 4.5 mmol/L (ref 3.5–5.1)
Sodium: 136 mmol/L (ref 135–145)

## 2022-07-07 MED ORDER — LISINOPRIL 5 MG PO TABS: 5.0000 mg | ORAL_TABLET | Freq: Every day | ORAL | 0 refills | Status: DC

## 2022-07-07 NOTE — Progress Notes (Signed)
Cardiology Office Note    Date:  07/07/2022   ID:  Charlene Norris, DOB Jul 26, 1941, MRN [id number]  PCP:  Lenard Simmer, MD  Cardiologist:  Ida Rogue, MD  Electrophysiologist:  None   Chief Complaint: Hospital follow-up  History of Present Illness:   Charlene Norris is a 81 y.o. female with history of A-fib previously not on anticoagulation, pancreatitis, hiatal hernia, HTN, HLD, prediabetes, and GERD who presents for hospital follow-up as outlined below.  Remote EKG from 2010 shows A-fib with RVR.  She has not been maintained on anticoagulation.  EKG from 2016 showed sinus rhythm.  She has reported regular cardiac testing through her PCPs office including echocardiogram.  We do not have these available for review.  She was admitted to the hospital from 8/25 through 07/04/2022 with A-fib with RVR, syncopal episode, and sepsis secondary to UTI.  On the morning of 07/03/2022, she got up to have a BM and was straining.  This was associated with dizziness and sweating.  She got up to lay on the bed, rolled over, and suffered a syncopal episode.  This was not witnessed, however her husband did hear the episode.  She came to on the floor.  EMS was called where she was found to be in A-fib with RVR.  She denied any preceding chest pain or dyspnea.  She did note ongoing dizziness and unsteadiness.  In the ED, she has had greater than 10 episodes of nonbilious, nonbloody emesis.  Presenting rhythm of A-fib with RVR in the 140s to 150s bpm.  Telemetry does show several episodes of prolonged RR intervals of 4.7 to 4.8 seconds in the ED. it was unclear if these episodes corresponded with her emesis.  High-sensitivity troponin negative x2.  Head CT showed no acute intracranial abnormality with low attenuation of the periventricular white matter presumed to be chronic microvascular ischemic changes.  MRI of the brain showed no evidence of recent infarction, hemorrhage, or mass with chronic microvascular  ischemic changes.  Following improvement of emesis, she had no further pauses/prolonged RR intervals on telemetry.  She was placed on a diltiazem drip with spontaneous conversion to sinus rhythm.  Echo was ordered, though unable to be completed due to no echo tech available.  She was discharged on low-dose Toprol-XL and apixaban.  She comes in accompanied by her husband today and is doing reasonably well from a cardiac perspective.  She did note increased fatigue and weakness upon arriving to her visit today.  She has been without symptoms of chest pain, dyspnea, palpitations, dizziness, presyncope, or further syncope.  She is tolerating metoprolol and apixaban.  No falls, hematochezia, or melena.  No significant lower extremity swelling.   Labs independently reviewed: 06/2022 - TSH normal, magnesium 2.0, A1c 4.9, potassium 3.4, BUN 18, serum creatinine 0.84, albumin 4.2, AST/ALT normal, Hgb 14.2, PLT 228  Past Medical History:  Diagnosis Date   Arthritis    Cataract    GERD (gastroesophageal reflux disease)    Hyperlipidemia    Hypertension    PAF (paroxysmal atrial fibrillation) ([place])    Pancreatitis    Pre-diabetes     Past Surgical History:  Procedure Laterality Date   ABDOMINAL HYSTERECTOMY     partial   CHOLECYSTECTOMY N/A 07/24/2015   Procedure: LAPAROSCOPIC CHOLECYSTECTOMY CONVERTED TO OPEN CHOLECYSTECTOMY ;  Surgeon: Christene Lye, MD;  Location: [facility] ORS;  Service: General;  Laterality: N/A;   ERCP N/A 12/30/2021   Procedure: ENDOSCOPIC RETROGRADE CHOLANGIOPANCREATOGRAPHY (ERCP);  Surgeon: Midge Minium, MD;  Location: [facility] ENDOSCOPY;  Service: Endoscopy;  Laterality: N/A;   EXPLORATORY LAPAROTOMY     JOINT REPLACEMENT Bilateral 2009, 2010   knee   PARATHYROID EXPLORATION  2012   growth/ Dr Willeen Cass   PARTIAL HYSTERECTOMY      Current Medications: Current Meds  Medication Sig   allopurinol (ZYLOPRIM) 100 MG tablet Take 100 mg by mouth daily.   apixaban (ELIQUIS) 5  MG TABS tablet Take 1 tablet (5 mg total) by mouth 2 (two) times daily.   cephALEXin (KEFLEX) 500 MG capsule Take 1 capsule (500 mg total) by mouth every 12 (twelve) hours for 4 days.   lisinopril (ZESTRIL) 5 MG tablet Take 1 tablet (5 mg total) by mouth daily.   lovastatin (MEVACOR) 40 MG tablet Take 40 mg by mouth in the morning.   metoprolol succinate (TOPROL-XL) 25 MG 24 hr tablet Take 0.5 tablets (12.5 mg total) by mouth daily.   Vitamin D, Ergocalciferol, (DRISDOL) 1.25 MG (50000 UNIT) CAPS capsule Take 50,000 Units by mouth 2 (two) times a week.   [DISCONTINUED] lisinopril (ZESTRIL) 10 MG tablet Take 10 mg by mouth daily.    Allergies:   Levofloxacin, Ampicillin, Celebrex [celecoxib], Nexium [esomeprazole magnesium], Ranitidine, and Zyrtec [cetirizine]   Social History   Socioeconomic History   Marital status: Married    Spouse name: Not on file   Number of children: Not on file   Years of education: Not on file   Highest education level: Not on file  Occupational History   Not on file  Tobacco Use   Smoking status: Former    Years: 5.00    Types: Cigarettes    Quit date: 11/09/1968    Years since quitting: 53.6   Smokeless tobacco: Never  Vaping Use   Vaping Use: Never used  Substance and Sexual Activity   Alcohol use: No    Alcohol/week: 0.0 standard drinks of alcohol   Drug use: No   Sexual activity: Not on file  Other Topics Concern   Not on file  Social History Narrative   Not on file   Social Determinants of Health   Financial Resource Strain: Not on file  Food Insecurity: Not on file  Transportation Needs: Not on file  Physical Activity: Not on file  Stress: Not on file  Social Connections: Not on file     Family History:  The patient's family history includes Breast cancer (age of onset: 64) in her sister; Diabetes in her father; Hypertension in her father; Parkinson's disease in her mother.  ROS:   12-point review of systems is negative unless  otherwise noted in the HPI.   EKGs/Labs/Other Studies Reviewed:    Studies reviewed were summarized above. The additional studies were reviewed today: None available for review.  EKG:  EKG is ordered today.  The EKG ordered today demonstrates NSR, 63 bpm, possible prior anterior infarct, no acute ST-T changes  Recent Labs: 07/03/2022: ALT 16; BUN 18; Creatinine, Ser 0.84; Hemoglobin 14.2; Magnesium 2.0; Platelets 228; Potassium 3.4; Sodium 139; TSH 2.012  Recent Lipid Panel No results found for: "CHOL", "TRIG", "HDL", "CHOLHDL", "VLDL", "LDLCALC", "LDLDIRECT"  PHYSICAL EXAM:    VS:  BP 110/68 (BP Location: Left Arm, Patient Position: Sitting, Cuff Size: Large)   Pulse 63   Ht 5\' 1"  (1.549 m)   Wt 157 lb 6.4 oz (71.4 kg)   SpO2 99%   BMI 29.74 kg/m   BMI: Body mass index is 29.74 kg/m.  Physical Exam Constitutional:      Appearance: She is well-developed.  HENT:     Head: Normocephalic and atraumatic.  Eyes:     General:        Right eye: No discharge.        Left eye: No discharge.  Neck:     Vascular: No JVD.  Cardiovascular:     Rate and Rhythm: Normal rate and regular rhythm.     Pulses:          Posterior tibial pulses are 2+ on the right side and 2+ on the left side.     Heart sounds: Normal heart sounds, S1 normal and S2 normal. Heart sounds not distant. No midsystolic click and no opening snap. No murmur heard.    No friction rub.  Pulmonary:     Effort: Pulmonary effort is normal. No respiratory distress.     Breath sounds: Normal breath sounds. No decreased breath sounds, wheezing or rales.  Chest:     Chest wall: No tenderness.  Abdominal:     General: There is no distension.  Musculoskeletal:     Cervical back: Normal range of motion.     Right lower leg: No edema.     Left lower leg: No edema.  Skin:    General: Skin is warm and dry.     Nails: There is no clubbing.  Neurological:     Mental Status: She is alert and oriented to person, place, and  time.  Psychiatric:        Speech: Speech normal.        Behavior: Behavior normal.        Thought Content: Thought content normal.        Judgment: Judgment normal.     Wt Readings from Last 3 Encounters:  07/07/22 157 lb 6.4 oz (71.4 kg)  07/03/22 162 lb (73.5 kg)  12/30/21 168 lb (76.2 kg)     ASSESSMENT & PLAN:   PAF: Maintaining sinus rhythm.  Continue Toprol-XL 12.5 mg daily.  CHA2DS2-VASc at least 4 (HTN, age x2, sex category).  Apixaban 5 mg twice daily (does not meet reduced dosing criteria).  Check BMP and CBC.  Obtain echo.  Place a ZIO AT to quantify A-fib burden.  Syncope: No further episodes.  Possibly vasovagal in etiology.  However, she was noted to have 2 prolonged R to R interval's in the ED, possibly in the setting of emesis.  No further prolonged pauses without emesis noted while admitted to the ED.  Place a ZIO AT.  Check echo.  We will also decrease her lisinopril to 5 mg daily in an effort to allow for more BP room given the addition of metoprolol.  Hypokalemia: Check BMP.   Disposition: F/u with Dr. Mariah Milling or an APP in 1 month.   Medication Adjustments/Labs and Tests Ordered: Current medicines are reviewed at length with the patient today.  Concerns regarding medicines are outlined above. Medication changes, Labs and Tests ordered today are summarized above and listed in the Patient Instructions accessible in Encounters.   Signed, Eula Listen, PA-C 07/07/2022 10:23 AM     [facility] [place] [address] [place], [place] [address] [phone]

## 2022-07-07 NOTE — Patient Instructions (Addendum)
Medication Instructions:   Your physician has recommended you make the following change in your medication:   Decrease Lisinopril to 5 mg daily.  *If you need a refill on your cardiac medications before your next appointment, please call your pharmacy*   Lab Work:  Your physician recommends that you return for lab work in:   Go to medical mall to have labs drawn today:  CBC/BMP  If you have labs (blood work) drawn today and your tests are completely normal, you will receive your results only by: MyChart Message (if you have MyChart) OR A paper copy in the mail If you have any lab test that is abnormal or we need to change your treatment, we will call you to review the results.   Testing/Procedures:  Your physician has requested that you have an echocardiogram. Echocardiography is a painless test that uses sound waves to create images of your heart. It provides your doctor with information about the size and shape of your heart and how well your heart's chambers and valves are working. This procedure takes approximately one hour. There are no restrictions for this procedure.  Your physician has recommended that you wear a Zio AT LIVE monitor.   This monitor is a medical device that records the heart's electrical activity. Doctors most often use these monitors to diagnose arrhythmias. Arrhythmias are problems with the speed or rhythm of the heartbeat. The monitor is a small device applied to your chest. You can wear one while you do your normal daily activities. While wearing this monitor if you have any symptoms to push the button and record what you felt. Once you have worn this monitor for the period of time provider prescribed (Usually 14 days), you will return the monitor device in the postage paid box. Once it is returned they will download the data collected and provide [place] with a report which the provider will then review and we will call you with those results. Important  tips:  Avoid showering during the first 24 hours of wearing the monitor. Avoid excessive sweating to help maximize wear time. Do not submerge the device, no hot tubs, and no swimming pools. Keep any lotions or oils away from the patch. After 24 hours you may shower with the patch on. Take brief showers with your back facing the shower head.  Do not remove patch once it has been placed because that will interrupt data and decrease adhesive wear time. Push the button when you have any symptoms and write down what you were feeling. Once you have completed wearing your monitor, remove and place into box which has postage paid and place in your outgoing mailbox.  If for some reason you have misplaced your box then call our office and we can provide another box and/or mail it off for you.      Follow-Up: At [facility], you and your health needs are our priority.  As part of our continuing mission to provide you with exceptional heart care, we have created designated Provider Care Teams.  These Care Teams include your primary Cardiologist (physician) and Advanced Practice Providers (APPs -  Physician Assistants and Nurse Practitioners) who all work together to provide you with the care you need, when you need it.  We recommend signing up for the patient portal called "MyChart".  Sign up information is provided on this After Visit Summary.  MyChart is used to connect with patients for Virtual Visits (Telemedicine).  Patients are able to view  lab/test results, encounter notes, upcoming appointments, etc.  Non-urgent messages can be sent to your provider as well.   To learn more about what you can do with MyChart, go to [email or web address].    Your next appointment:   1 month(s)  The format for your next appointment:   In Person  Provider:   You will see one of the following Advanced Practice Providers on your designated Care Team:   Nicolasa Ducking, NP Eula Listen, PA-C [place]  Fransico Michael, PA-C Charlsie Quest, NP    Important Information About Sugar

## 2022-07-27 ENCOUNTER — Ambulatory Visit: Payer: Medicare HMO | Attending: Physician Assistant

## 2022-07-27 DIAGNOSIS — R55 Syncope and collapse: Secondary | ICD-10-CM

## 2022-07-27 DIAGNOSIS — I4891 Unspecified atrial fibrillation: Secondary | ICD-10-CM

## 2022-07-29 DIAGNOSIS — I4891 Unspecified atrial fibrillation: Secondary | ICD-10-CM | POA: Diagnosis not present

## 2022-07-29 DIAGNOSIS — R55 Syncope and collapse: Secondary | ICD-10-CM | POA: Diagnosis not present

## 2022-08-07 ENCOUNTER — Other Ambulatory Visit: Payer: Medicare HMO

## 2022-08-11 ENCOUNTER — Ambulatory Visit: Payer: Medicare HMO | Admitting: Physician Assistant

## 2022-08-28 ENCOUNTER — Ambulatory Visit: Payer: Medicare HMO | Attending: Physician Assistant

## 2022-08-28 DIAGNOSIS — I480 Paroxysmal atrial fibrillation: Secondary | ICD-10-CM | POA: Diagnosis not present

## 2022-08-28 LAB — ECHOCARDIOGRAM COMPLETE
AR max vel: 2.65 cm2
AV Area VTI: 2.51 cm2
AV Area mean vel: 2.33 cm2
AV Mean grad: 5 mmHg
AV Peak grad: 9.7 mmHg
Ao pk vel: 1.56 m/s
Area-P 1/2: 2.87 cm2
Calc EF: 56.6 %
S' Lateral: 3 cm
Single Plane A2C EF: 56 %
Single Plane A4C EF: 61 %

## 2022-08-31 NOTE — Telephone Encounter (Signed)
Eliquis Refill  

## 2022-09-01 MED ORDER — APIXABAN 5 MG PO TABS: 5.0000 mg | ORAL_TABLET | Freq: Two times a day (BID) | ORAL | 5 refills | Status: DC

## 2022-09-04 NOTE — Progress Notes (Unsigned)
Cardiology Office Note    Date:  09/07/2022   ID:  Charlene Norris, DOB 22-May-1941, MRN [id number]  PCP:  Alan Mulder, MD  Cardiologist:  Julien Nordmann, MD  Electrophysiologist:  None   Chief Complaint: Follow-up  History of Present Illness:   Charlene Norris is a 81 y.o. female with history of PAF, pancreatitis, hiatal hernia, HTN, HLD, prediabetes, and GERD who presents for follow-up of A-fib.   Remote EKG from 2010 showed A-fib with RVR.  She has previously not been maintained on anticoagulation.  EKG from 2016 showed sinus rhythm.  She has reported regular cardiac testing through her PCPs office including echocardiogram.  We do not have these available for review.   She was admitted to the hospital in 06/2022 with A-fib with RVR, syncopal episode, and sepsis secondary to UTI.  On the morning of 07/03/2022, she got up to have a BM and was straining.  This was associated with dizziness and sweating.  She got up to lay on the bed, rolled over, and suffered a syncopal episode.  This was not witnessed, however her husband did hear the episode.  She came to on the floor.  EMS was called where she was found to be in A-fib with RVR.  She denied any preceding chest pain or dyspnea.  She did note ongoing dizziness and unsteadiness.  In the ED, she had greater than 10 episodes of nonbilious, nonbloody emesis.  Presenting rhythm of A-fib with RVR in the 140s to 150s bpm.  Telemetry showed several episodes of prolonged RR intervals of 4.7 to 4.8 seconds in the ED. it was unclear if these episodes corresponded with her emesis.  High-sensitivity troponin negative x2.  Head CT showed no acute intracranial abnormality with low attenuation of the periventricular white matter presumed to be chronic microvascular ischemic changes.  MRI of the brain showed no evidence of recent infarction, hemorrhage, or mass with chronic microvascular ischemic changes.  Following improvement of emesis, she had no further  pauses/prolonged RR intervals on telemetry.  She was placed on a diltiazem drip with spontaneous conversion to sinus rhythm.  Echo was ordered, though unable to be completed due to no echo tech available.  She was discharged on low-dose Toprol-XL and apixaban.  She was seen in hospital follow-up on 07/07/2022 and was doing well from a cardiac perspective, without symptoms of angina or decompensation.  She did note some weakness.  She was maintaining sinus rhythm.  Subsequent echo demonstrated an EF of 60 to 65%, no regional wall motion abnormalities, grade 1 diastolic dysfunction, normal RV systolic function, ventricular cavity size, and PASP, mild mitral regurgitation, and an estimated right atrial pressure of 3 mmHg.  Outpatient cardiac monitoring showed a predominant rhythm of sinus with an average rate of 60 bpm (range 43 to 156 bpm), 5 episodes of SVT with the fastest and longest interval lasting 10 beats with a maximum rate of 156 bpm, and rare atrial/ventricular ectopy.  Triggered events corresponded with normal sinus rhythm.  She can then accompanied by her husband today and is doing well from a cardiac perspective.  No symptoms of angina or decompensation.  No palpitations, dizziness, presyncope, or syncope.  No significant lower extremity swelling, falls, or symptoms concerning for bleeding.  She is adherent and tolerating metoprolol and apixaban.  She does note some fatigue on the days following poor sleep.  If she gets at least 6 hours of sleep she feels well the following day.  Labs independently reviewed: 06/2022 - Hgb 14.6, PLT 232, potassium 4.5, BUN 15, serum creatinine 0.88, TSH normal, magnesium 2.0, A1c 4.9, albumin 4.2, AST/ALT normal  Past Medical History:  Diagnosis Date   Arthritis    Cataract    GERD (gastroesophageal reflux disease)    Hyperlipidemia    Hypertension    PAF (paroxysmal atrial fibrillation) ([facility])    Pancreatitis    Pre-diabetes     Past Surgical History:   Procedure Laterality Date   ABDOMINAL HYSTERECTOMY     partial   CHOLECYSTECTOMY N/A 07/24/2015   Procedure: LAPAROSCOPIC CHOLECYSTECTOMY CONVERTED TO OPEN CHOLECYSTECTOMY ;  Surgeon: Christene Lye, MD;  Location: [facility] ORS;  Service: General;  Laterality: N/A;   ERCP N/A 12/30/2021   Procedure: ENDOSCOPIC RETROGRADE CHOLANGIOPANCREATOGRAPHY (ERCP);  Surgeon: Lucilla Lame, MD;  Location: [facility] ENDOSCOPY;  Service: Endoscopy;  Laterality: N/A;   EXPLORATORY LAPAROTOMY     JOINT REPLACEMENT Bilateral 2009, 2010   knee   PARATHYROID EXPLORATION  2012   growth/ Dr Richardson Landry   PARTIAL HYSTERECTOMY      Current Medications: Current Meds  Medication Sig   apixaban (ELIQUIS) 5 MG TABS tablet Take 1 tablet (5 mg total) by mouth 2 (two) times daily.   lisinopril (ZESTRIL) 5 MG tablet Take 1 tablet (5 mg total) by mouth daily.   lovastatin (MEVACOR) 40 MG tablet Take 40 mg by mouth in the morning.   metoprolol succinate (TOPROL-XL) 25 MG 24 hr tablet Take 0.5 tablets (12.5 mg total) by mouth daily.   Vitamin D, Ergocalciferol, (DRISDOL) 1.25 MG (50000 UNIT) CAPS capsule Take 50,000 Units by mouth 2 (two) times a week.    Allergies:   Levofloxacin, Ampicillin, Celebrex [celecoxib], Nexium [esomeprazole magnesium], Ranitidine, and Zyrtec [cetirizine]   Social History   Socioeconomic History   Marital status: Married    Spouse name: Not on file   Number of children: Not on file   Years of education: Not on file   Highest education level: Not on file  Occupational History   Not on file  Tobacco Use   Smoking status: Former    Years: 5.00    Types: Cigarettes    Quit date: 11/09/1968    Years since quitting: 53.8   Smokeless tobacco: Never  Vaping Use   Vaping Use: Never used  Substance and Sexual Activity   Alcohol use: No    Alcohol/week: 0.0 standard drinks of alcohol   Drug use: No   Sexual activity: Not on file  Other Topics Concern   Not on file  Social History Narrative    Not on file   Social Determinants of Health   Financial Resource Strain: Not on file  Food Insecurity: Not on file  Transportation Needs: Not on file  Physical Activity: Not on file  Stress: Not on file  Social Connections: Not on file     Family History:  The patient's family history includes Breast cancer (age of onset: 72) in her sister; Diabetes in her father; Hypertension in her father; Parkinson's disease in her mother.  ROS:   12-point review of systems is negative unless otherwise noted in the HPI.   EKGs/Labs/Other Studies Reviewed:    Studies reviewed were summarized above. The additional studies were reviewed today:  Zio patch 07/2022: Patient had a min HR of 43 bpm, max HR of 156 bpm, and avg HR of 60 bpm. Predominant underlying rhythm was Sinus Rhythm. 5 Supraventricular Tachycardia runs occurred, the run with the  fastest interval lasting 10 beats with a max rate of 156 bpm (avg 107  bpm); the run with the fastest interval was also the longest. Isolated SVEs were rare (<1.0%), SVE Couplets were rare (<1.0%), and SVE Triplets were rare (<1.0%). Isolated VEs were rare (<1.0%), and no VE Couplets or VE Triplets were present.  __________  2D echo 08/28/2022: 1. Left ventricular ejection fraction, by estimation, is 60 to 65%. The  left ventricle has normal function. The left ventricle has no regional  wall motion abnormalities. Left ventricular diastolic parameters are  consistent with Grade I diastolic  dysfunction (impaired relaxation). The average left ventricular global  longitudinal strain is -22.2 %.   2. Right ventricular systolic function is normal. The right ventricular  size is normal. There is normal pulmonary artery systolic pressure. The  estimated right ventricular systolic pressure is 27.1 mmHg.   3. The mitral valve is normal in structure. Mild mitral valve  regurgitation. No evidence of mitral stenosis.   4. The aortic valve was not well visualized.  Aortic valve regurgitation  is not visualized. No aortic stenosis is present.   5. The inferior vena cava is normal in size with greater than 50%  respiratory variability, suggesting right atrial pressure of 3 mmHg.   EKG:  EKG is ordered today.  The EKG ordered today demonstrates NSR, 63 bpm, no acute ST-T changes  Recent Labs: 07/03/2022: ALT 16; Magnesium 2.0; TSH 2.012 07/07/2022: BUN 15; Creatinine, Ser 0.88; Hemoglobin 14.6; Platelets 232; Potassium 4.5; Sodium 136  Recent Lipid Panel No results found for: "CHOL", "TRIG", "HDL", "CHOLHDL", "VLDL", "LDLCALC", "LDLDIRECT"  PHYSICAL EXAM:    VS:  BP (!) 152/74 (BP Location: Left Arm)   Pulse 63   Ht 5\' 1"  (1.549 m)   Wt 154 lb 12.8 oz (70.2 kg)   SpO2 99%   BMI 29.25 kg/m   BMI: Body mass index is 29.25 kg/m.  Physical Exam Vitals reviewed.  Constitutional:      Appearance: She is well-developed.  HENT:     Head: Normocephalic and atraumatic.  Eyes:     General:        Right eye: No discharge.        Left eye: No discharge.  Neck:     Vascular: No JVD.  Cardiovascular:     Rate and Rhythm: Normal rate and regular rhythm.     Pulses:          Posterior tibial pulses are 2+ on the right side and 2+ on the left side.     Heart sounds: Normal heart sounds, S1 normal and S2 normal. Heart sounds not distant. No midsystolic click and no opening snap. No murmur heard.    No friction rub.  Pulmonary:     Effort: Pulmonary effort is normal. No respiratory distress.     Breath sounds: Normal breath sounds. No decreased breath sounds, wheezing or rales.  Chest:     Chest wall: No tenderness.  Abdominal:     General: There is no distension.  Musculoskeletal:     Cervical back: Normal range of motion.     Right lower leg: No edema.     Left lower leg: No edema.  Skin:    General: Skin is warm and dry.     Nails: There is no clubbing.  Neurological:     Mental Status: She is alert and oriented to person, place, and time.   Psychiatric:  Speech: Speech normal.        Behavior: Behavior normal.        Thought Content: Thought content normal.        Judgment: Judgment normal.     Wt Readings from Last 3 Encounters:  09/07/22 154 lb 12.8 oz (70.2 kg)  07/07/22 157 lb 6.4 oz (71.4 kg)  07/03/22 162 lb (73.5 kg)     ASSESSMENT & PLAN:   PAF: Maintaining sinus rhythm.  Subsequent Zio patch showed no further A-fib burden.  Continue Toprol-XL 12.5 mg daily.  CHA2DS2-VASc at least 4 (HTN, age x2, sex category).  She remains on apixaban 5 mg twice daily (does not meet reduced dosing criteria).  Recent renal function, electrolytes, and Hgb/PLT stable.  PSVT: Minimal burden noted on outpatient cardiac monitoring.  Metoprolol as outlined above.  Syncope: No further episodes.  Possibly vasovagal in etiology in the setting of straining with BM.  No evidence of high-grade AV block or prolonged pauses noted on outpatient cardiac monitoring.  Echo without any significant structural abnormalities.  HLD: She remains on lovastatin and is followed by PCP.  Insomnia: Likely the main contributing factor to her intermittent fatigue as she notes no fatigue when she sleeps well.  Recommend she discuss this further with her PCP.    Disposition: F/u with Dr. Mariah Milling or an APP in 6 months.   Medication Adjustments/Labs and Tests Ordered: Current medicines are reviewed at length with the patient today.  Concerns regarding medicines are outlined above. Medication changes, Labs and Tests ordered today are summarized above and listed in the Patient Instructions accessible in Encounters.   Signed, Eula Listen, PA-C 09/07/2022 1:25 PM     [facility] [address] [place], [place] [address] [phone]

## 2022-09-07 ENCOUNTER — Ambulatory Visit: Payer: Medicare HMO | Attending: Physician Assistant | Admitting: Physician Assistant

## 2022-09-07 ENCOUNTER — Encounter: Payer: Self-pay | Admitting: Physician Assistant

## 2022-09-07 VITALS — BP 152/74 | HR 63 | Ht 61.0 in | Wt 154.8 lb

## 2022-09-07 DIAGNOSIS — Z87898 Personal history of other specified conditions: Secondary | ICD-10-CM

## 2022-09-07 DIAGNOSIS — I480 Paroxysmal atrial fibrillation: Secondary | ICD-10-CM

## 2022-09-07 DIAGNOSIS — G4700 Insomnia, unspecified: Secondary | ICD-10-CM | POA: Diagnosis not present

## 2022-09-07 DIAGNOSIS — I471 Supraventricular tachycardia, unspecified: Secondary | ICD-10-CM

## 2022-09-07 NOTE — Patient Instructions (Signed)
Medication Instructions:  No changes at this time.   *If you need a refill on your cardiac medications before your next appointment, please call your pharmacy*   Lab Work: None  If you have labs (blood work) drawn today and your tests are completely normal, you will receive your results only by: MyChart Message (if you have MyChart) OR A paper copy in the mail If you have any lab test that is abnormal or we need to change your treatment, we will call you to review the results.   Testing/Procedures: None   Follow-Up: At [facility], you and your health needs are our priority.  As part of our continuing mission to provide you with exceptional heart care, we have created designated Provider Care Teams.  These Care Teams include your primary Cardiologist (physician) and Advanced Practice Providers (APPs -  Physician Assistants and Nurse Practitioners) who all work together to provide you with the care you need, when you need it.   Your next appointment:   6 month(s)  The format for your next appointment:   In Person  Provider:   Timothy Gollan, MD or Ryan Dunn, PA-C        Important Information About Sugar       

## 2022-09-28 ENCOUNTER — Encounter: Payer: Self-pay | Admitting: Emergency Medicine

## 2022-09-28 ENCOUNTER — Emergency Department
Admission: EM | Admit: 2022-09-28 | Discharge: 2022-09-28 | Disposition: A | Discharge status: Home / Self Care | Payer: Medicare HMO | Attending: Emergency Medicine | Admitting: Emergency Medicine

## 2022-09-28 ENCOUNTER — Emergency Department: Payer: Medicare HMO

## 2022-09-28 ENCOUNTER — Other Ambulatory Visit: Payer: Self-pay

## 2022-09-28 DIAGNOSIS — R110 Nausea: Secondary | ICD-10-CM | POA: Insufficient documentation

## 2022-09-28 DIAGNOSIS — R6884 Jaw pain: Secondary | ICD-10-CM | POA: Diagnosis present

## 2022-09-28 DIAGNOSIS — R0789 Other chest pain: Secondary | ICD-10-CM | POA: Insufficient documentation

## 2022-09-28 DIAGNOSIS — Z7901 Long term (current) use of anticoagulants: Secondary | ICD-10-CM | POA: Insufficient documentation

## 2022-09-28 LAB — CBC
HCT: 42.4 % (ref 36.0–46.0)
Hemoglobin: 13.9 g/dL (ref 12.0–15.0)
MCH: 28 pg (ref 26.0–34.0)
MCHC: 32.8 g/dL (ref 30.0–36.0)
MCV: 85.3 fL (ref 80.0–100.0)
Platelets: 200 10*3/uL (ref 150–400)
RBC: 4.97 MIL/uL (ref 3.87–5.11)
RDW: 13.3 % (ref 11.5–15.5)
WBC: 4.7 10*3/uL (ref 4.0–10.5)
nRBC: 0 % (ref 0.0–0.2)

## 2022-09-28 LAB — URINALYSIS, ROUTINE W REFLEX MICROSCOPIC
Bilirubin Urine: NEGATIVE
Glucose, UA: NEGATIVE mg/dL
Hgb urine dipstick: NEGATIVE
Ketones, ur: NEGATIVE mg/dL
Leukocytes,Ua: NEGATIVE
Nitrite: NEGATIVE
Protein, ur: NEGATIVE mg/dL
Specific Gravity, Urine: 1.013 (ref 1.005–1.030)
pH: 9 — ABNORMAL HIGH (ref 5.0–8.0)

## 2022-09-28 LAB — COMPREHENSIVE METABOLIC PANEL
ALT: 14 U/L (ref 0–44)
AST: 21 U/L (ref 15–41)
Albumin: 4.2 g/dL (ref 3.5–5.0)
Alkaline Phosphatase: 66 U/L (ref 38–126)
Anion gap: 9 (ref 5–15)
BUN: 11 mg/dL (ref 8–23)
CO2: 27 mmol/L (ref 22–32)
Calcium: 10.3 mg/dL (ref 8.9–10.3)
Chloride: 105 mmol/L (ref 98–111)
Creatinine, Ser: 0.84 mg/dL (ref 0.44–1.00)
GFR, Estimated: >60 mL/min (ref >60)
Glucose, Bld: 111 mg/dL — ABNORMAL HIGH (ref 70–99)
Potassium: 4.4 mmol/L (ref 3.5–5.1)
Sodium: 141 mmol/L (ref 135–145)
Total Bilirubin: 0.8 mg/dL (ref 0.3–1.2)
Total Protein: 7.1 g/dL (ref 6.5–8.1)

## 2022-09-28 LAB — TROPONIN I (HIGH SENSITIVITY)
Troponin I (High Sensitivity): 3 ng/L (ref <18)
Troponin I (High Sensitivity): 3 ng/L (ref <18)

## 2022-09-28 LAB — LIPASE, BLOOD: Lipase: 34 U/L (ref 11–51)

## 2022-09-28 NOTE — ED Triage Notes (Signed)
Pt presents to the ED via POV due to jaw pain and upper abdominal pain. Pt states the jaw pain started last night and kept getting worse throughout the night. Pt states the abdominal pain feels sort of like her indigestion but more pressure. Pt denies VD. Pt A&Ox4.

## 2022-09-28 NOTE — Discharge Instructions (Signed)
Your workup in the Emergency Department today was reassuring.  We did not find any specific abnormalities.  We recommend you drink plenty of fluids, take your regular medications and/or any new ones prescribed today, and follow up with the doctor(s) listed in these documents as recommended.  Return to the Emergency Department if you develop new or worsening symptoms that concern you.  

## 2022-09-28 NOTE — ED Provider Notes (Signed)
[facility] Provider Note    Event Date/Time   First MD Initiated Contact with Patient 09/28/22 0719     (approximate)   History   Abdominal Pain   HPI  Charlene Norris is a 81 y.o. female whose medical history includes (paroxysmal) atrial fibrillation on Eliquis with no known coronary artery disease or history of ACS.  Her cardiologist is Ryan Dunn/Dr. Mariah Milling.  She presents by private vehicle for evaluation of pain in her jaw as well as some exertional dyspnea.  This started about 2 to 3 AM last night and has persisted.  She said it was quite severe at 1 point during the night but it is much better now, and seemed to improve when she arrived in the emergency department.  She said that she was having some dull aching pain either in the upper part of her abdomen or the lower part of her chest.  She also had some nausea but no vomiting.  She has not had these symptoms previously.  She has no prescription for nitroglycerin.  She did not feel weak or dizzy and was having no numbness nor weakness in her extremities.  She is able to ambulate but when she moves around and exerted herself she felt a little bit short of breath although she does not feel short of breath right now.  She has no history of lung disease and no prior smoking history.  She last saw her cardiologist within the last couple of weeks and there were no concerns at that time.  She has had no recent medication changes and has not missed any doses of Eliquis nor her "heart medicine" which she believes is metoprolol recently.     Physical Exam   Triage Vital Signs: ED Triage Vitals  Enc Vitals Group     BP 09/28/22 0705 (!) 149/92     Pulse Rate 09/28/22 0705 62     Resp 09/28/22 0705 18     Temp 09/28/22 0705 (!) 97.5 F (36.4 C)     Temp Source 09/28/22 0705 Oral     SpO2 09/28/22 0705 99 %     Weight 09/28/22 0706 70 kg (154 lb 5.2 oz)     Height 09/28/22 0706 1.549 m (5\' 1" )     Head  Circumference --      Peak Flow --      Pain Score 09/28/22 0705 5     Pain Loc --      Pain Edu? --      Excl. in GC? --     Most recent vital signs: Vitals:   09/28/22 0705 09/28/22 1006  BP: (!) 149/92 (!) 140/88  Pulse: 62 (!) 55  Resp: 18 18  Temp: (!) 97.5 F (36.4 C)   SpO2: 99% 99%     General: Awake, no distress.  Well-appearing. CV:  Good peripheral perfusion.  Regular rate and rhythm, normal heart sounds. Resp:  Normal effort.  Lungs are clear to auscultation bilaterally.  No accessory muscle usage. Abd:  No distention.  No tenderness to palpation including the epigastrium or right upper quadrant.  Negative Murphy sign.  No guarding or rebound. Other:  Patient is pleasant, calm and cooperative, oriented and at her baseline mental status, laughing and joking with me and in no distress.   ED Results / Procedures / Treatments   Labs (all labs ordered are listed, but only abnormal results are displayed) Labs Reviewed  COMPREHENSIVE METABOLIC PANEL -  Abnormal; Notable for the following components:      Result Value   Glucose, Bld 111 (*)    All other components within normal limits  URINALYSIS, ROUTINE W REFLEX MICROSCOPIC - Abnormal; Notable for the following components:   Color, Urine YELLOW (*)    APPearance CLEAR (*)    pH 9.0 (*)    All other components within normal limits  LIPASE, BLOOD  CBC  TROPONIN I (HIGH SENSITIVITY)  TROPONIN I (HIGH SENSITIVITY)     EKG  ED ECG REPORT I, Loleta Rose, the attending physician, personally viewed and interpreted this ECG.  Date: 09/28/2022 EKG Time: 7:09 AM Rate: 57 Rhythm: Sinus bradycardia QRS Axis: normal Intervals: normal ST/T Wave abnormalities: normal Narrative Interpretation: no evidence of acute ischemia    RADIOLOGY I viewed and interpreted the patient's 1 view chest x-ray and I see no evidence of pneumonia, widened mediastinum, nor other acute abnormality.  I also read the radiologist's  report, which confirmed no acute findings.    PROCEDURES:  Critical Care performed: No  .1-3 Lead EKG Interpretation  Performed by: Loleta Rose, MD Authorized by: Loleta Rose, MD     Interpretation: normal     ECG rate:  60   ECG rate assessment: normal     Rhythm: sinus rhythm     Ectopy: none     Conduction: normal      MEDICATIONS ORDERED IN ED: Medications - No data to display   IMPRESSION / MDM / ASSESSMENT AND PLAN / ED COURSE  I reviewed the triage vital signs and the nursing notes.                              Differential diagnosis includes, but is not limited to, angina, ACS including unstable angina, acid reflux or gastritis, nonspecific virus, musculoskeletal strain such as TMJ to account for the jaw pain, anxiety/panic attack.  Less likely biliary colic or pancreatitis.  Patient's presentation is most consistent with acute presentation with potential threat to life or bodily function.  Patient is currently well-appearing and in no distress.  Her vital signs are very appropriate and not indicative of an emergent condition.  Labs/studies ordered: EKG, cardiac monitoring, 1 view chest x-ray, CBC, urinalysis, lipase, CMP, high-sensitivity troponin x2.  Initial labs are all reassuring and within normal limits although the initial troponin is still pending.  I talked with the patient and we agreed with the plan for observation and a repeat troponin.  If her symptoms remain negligible/absent and her work-up is all reassuring, she will likely be appropriate for discharge and outpatient follow-up.  She and her husband agree with this plan.  The patient is on the cardiac monitor to evaluate for evidence of arrhythmia and/or significant heart rate changes.   Clinical Course as of 09/28/22 1112  Mon Sep 28, 2022  4496 Troponin I (High Sensitivity): 3 Initial troponin is within normal limits. [CF]  [id number] I reassessed the patient and she is feeling well.  She has had  no repeat of her symptoms.  She said that her jaw still little bit sore but nothing feels bad like it did last night.  I reminded her that we will be checking a repeat troponin and then reassess and appropriate disposition plan. [CF]  1050 Troponin I (High Sensitivity): 3 Repeat high-sensitivity troponin is stable at 3. [CF]  1110 I reassessed the patient and she feels well and is eager to  go home.  I let her know about the reassuring results but I had my usual conversation about why I cannot tell her exactly what is going on.  She said that in the interim, she remembers that her dentist has told her before that she grinds her teeth and possibly that is why her jaw is hurting.  It feels much better now.  Given that she has no ongoing pain and no evidence of an acute or emergent medical/cardiac condition, she and her husband want to go home and I think that is appropriate.  I am putting in the consult back to cardiology for her to schedule follow-up appointment and I gave her strict return precautions.  She understands and agrees with the plan. [CF]    Clinical Course User Index [CF] Loleta Rose, MD     FINAL CLINICAL IMPRESSION(S) / ED DIAGNOSES   Final diagnoses:  Chest discomfort  Jaw pain     Rx / DC Orders   ED Discharge Orders          Ordered    Ambulatory referral to Cardiology       Comments: If you have not heard from the Cardiology office within the next 72 hours please call [phone].   09/28/22 1112             Note:  This document was prepared using Dragon voice recognition software and may include unintentional dictation errors.   Loleta Rose, MD 09/28/22 1112

## 2022-09-28 NOTE — ED Notes (Signed)
See triage note  Presents with some chest/epigastric pain  states it started this am   Denies any any pain at present  monitor shows  slow afib

## 2022-09-28 NOTE — ED Triage Notes (Signed)
Pt also states she has a hx of AFIB on eliquis

## 2022-10-26 ENCOUNTER — Telehealth: Payer: Self-pay | Admitting: Physician Assistant

## 2022-10-26 MED ORDER — METOPROLOL SUCCINATE ER 25 MG PO TB24: 12.5000 mg | ORAL_TABLET | Freq: Every day | ORAL | 3 refills | Status: DC

## 2022-10-26 NOTE — Telephone Encounter (Signed)
Patient called back requesting a 30 day supply instead of 90 days.

## 2022-10-26 NOTE — Telephone Encounter (Signed)
*  STAT* If patient is at the pharmacy, call can be transferred to refill team.   1. Which medications need to be refilled? (please list name of each medication and dose if known)  metoprolol succinate (TOPROL-XL) 25 MG 24 hr tablet  2. Which pharmacy/location (including street and city if local pharmacy) is medication to be sent to? [id number] - [place], [place] - [address]  3. Do they need a 30 day or 90 day supply?  90 day supply  This was prescribed in the hospital. Please confirm whether or not patient needs to continue taking.

## 2022-10-26 NOTE — Telephone Encounter (Signed)
Requested Prescriptions   Signed Prescriptions Disp Refills   metoprolol succinate (TOPROL-XL) 25 MG 24 hr tablet 15 tablet 3    Sig: Take 0.5 tablets (12.5 mg total) by mouth daily.    Authorizing Provider: Sondra Barges    Ordering User: Thayer Headings, Ruthell Rummage   Spoke with patient and advised her to continue medication and that a prescription refill was sent to her pharmacy as requested.

## 2022-11-04 LAB — HM DEXA SCAN

## 2023-02-22 ENCOUNTER — Other Ambulatory Visit: Payer: Self-pay | Admitting: Physician Assistant

## 2023-02-22 ENCOUNTER — Other Ambulatory Visit: Payer: Self-pay | Admitting: Cardiovascular Disease

## 2023-02-22 NOTE — Telephone Encounter (Signed)
Prescription refill request for Eliquis received. Indication: AF Last office visit: 09/07/22  R Dunn PA-C Scr: 0.84 on 09/28/22 Age: 82 Weight: 70.2kg  Based on above findings Eliquis 5mg  twice daily is the appropriate dose.  Refill approved.

## 2023-02-22 NOTE — Telephone Encounter (Signed)
Please review

## 2023-02-25 NOTE — Progress Notes (Signed)
Cardiology Office Note    Date:  03/05/2023   ID:  Charlene Norris, DOB 1941-03-13, MRN [id number]  PCP:  Alan Mulder, MD  Cardiologist:  Julien Nordmann, MD  Electrophysiologist:  None   Chief Complaint: Follow up  History of Present Illness:   Charlene Norris is a 82 y.o. female with history of PAF, pancreatitis, left-sided carotid artery stenosis, hiatal hernia, HTN, HLD, prediabetes, and GERD who presents for follow-up of A-fib.   Remote EKG from 2010 showed A-fib with RVR.  She has previously not been maintained on anticoagulation.  EKG from 2016 showed sinus rhythm.  She has reported regular cardiac testing through her PCP's office including echocardiogram.  We do not have these available for review.   She was admitted to the hospital in 06/2022 with A-fib with RVR, syncopal episode, and sepsis secondary to UTI.  On the morning of 07/03/2022, she got up to have a BM and was straining.  This was associated with dizziness and sweating.  She got up to lay on the bed, rolled over, and suffered a syncopal episode.  This was not witnessed, however her husband did hear the episode.  She came to on the floor.  EMS was called where she was found to be in A-fib with RVR.  She denied any preceding chest pain or dyspnea.  She did note ongoing dizziness and unsteadiness.  In the ED, she had greater than 10 episodes of nonbilious, nonbloody emesis.  Presenting rhythm of A-fib with RVR in the 140s to 150s bpm.  Telemetry showed several episodes of prolonged R-R intervals of 4.7 to 4.8 seconds in the ED.  It was unclear if these episodes corresponded with her emesis.  High-sensitivity troponin negative x2.  Head CT showed no acute intracranial abnormality with low attenuation of the periventricular white matter presumed to be chronic microvascular ischemic changes.  MRI of the brain showed no evidence of recent infarction, hemorrhage, or mass with chronic microvascular ischemic changes.  Following  improvement of emesis, she had no further pauses/prolonged R-R intervals on telemetry.  She was placed on a diltiazem drip with spontaneous conversion to sinus rhythm.  Echo was ordered, though unable to be completed due to no echo tech available.  She was discharged on low-dose Toprol-XL and apixaban.  She was seen in hospital follow-up on 07/07/2022 and was doing well from a cardiac perspective, without symptoms of angina or decompensation.  She did note some weakness.  She was maintaining sinus rhythm.  Subsequent echo demonstrated an EF of 60 to 65%, no regional wall motion abnormalities, grade 1 diastolic dysfunction, normal RV systolic function, ventricular cavity size, and PASP, mild mitral regurgitation, and an estimated right atrial pressure of 3 mmHg.  Outpatient cardiac monitoring showed a predominant rhythm of sinus with an average rate of 60 bpm (range 43 to 156 bpm), 5 episodes of SVT with the fastest and longest interval lasting 10 beats with a maximum rate of 156 bpm, and rare atrial/ventricular ectopy.  Triggered events corresponded with normal sinus rhythm.  She was last seen in the office in 08/2022 and was without symptoms of angina or cardiac decompensation.  She was seen in the ED in 09/2022 with jaw pain and exertional dyspnea.  EKG nonacute.  High-sensitivity troponin negative x 2.  Chest x-ray showed no active disease.  She was subsequently found to have a deep cavity by her dentist.  Carotid artery ultrasound performed in 11/2022 through PCP's office showed 50 to  69% left ICA stenosis with no evidence of disease along the right ICA.  Echo performed at that time, through PCPs office, showed an EF greater than 55% with trace mitral valve regurgitation.  She comes in accompanied by her husband and is doing well from a cardiac perspective, without symptoms of angina or cardiac decompensation.  She does note some longstanding exertional dyspnea and fatigue.  She reports she is more  symptomatic on the nights and that she does not sleep well.  No falls, hematochezia, or melena.  Adherent and tolerating apixaban twice daily.  Not currently checking blood pressure at home.  Weight stable.   Labs independently reviewed: 09/2022 - Hgb 13.9, PLT 200, potassium 4.4, BUN 11, serum creatinine 0.84, albumin 4.2, AST/ALT normal 06/2022 - TSH normal, magnesium 2.0, A1c 4.9  Past Medical History:  Diagnosis Date   Arthritis    Cataract    GERD (gastroesophageal reflux disease)    Hyperlipidemia    Hypertension    PAF (paroxysmal atrial fibrillation) (HCC)    Pancreatitis    Pre-diabetes     Past Surgical History:  Procedure Laterality Date   ABDOMINAL HYSTERECTOMY     partial   CHOLECYSTECTOMY N/A 07/24/2015   Procedure: LAPAROSCOPIC CHOLECYSTECTOMY CONVERTED TO OPEN CHOLECYSTECTOMY ;  Surgeon: Kieth Brightly, MD;  Location: [facility] ORS;  Service: General;  Laterality: N/A;   ERCP N/A 12/30/2021   Procedure: ENDOSCOPIC RETROGRADE CHOLANGIOPANCREATOGRAPHY (ERCP);  Surgeon: Midge Minium, MD;  Location: [facility] END[facility];  Service: Endoscopy;  Laterality: N/A;   EXPLORATORY LAPAROTOMY     JOINT REPLACEMENT Bilateral 2009, 2010   knee   PARATHYROID EXPLORATION  2012   growth/ Dr Willeen Cass   PARTIAL HYSTERECTOMY      Current Medications: Current Meds  Medication Sig   ELIQUIS 5 MG TABS tablet TAKE 1 TABLET(5 MG) BY MOUTH TWICE DAILY   lisinopril (ZESTRIL) 5 MG tablet Take 1 tablet (5 mg total) by mouth daily.   lovastatin (MEVACOR) 40 MG tablet Take 40 mg by mouth in the morning.   metoprolol succinate (TOPROL-XL) 25 MG 24 hr tablet TAKE 1/2 TABLET(12.5 MG) BY MOUTH DAILY (Patient taking differently: 25 mg daily.)   Vitamin D, Ergocalciferol, (DRISDOL) 1.25 MG (50000 UNIT) CAPS capsule Take 50,000 Units by mouth every 7 (seven) days.    Allergies:   Levofloxacin, Ampicillin, Celebrex [celecoxib], Nexium [esomeprazole magnesium], Ranitidine, and Zyrtec [cetirizine]    Social History   Socioeconomic History   Marital status: Married    Spouse name: Not on file   Number of children: Not on file   Years of education: Not on file   Highest education level: Not on file  Occupational History   Not on file  Tobacco Use   Smoking status: Former    Years: 5    Types: Cigarettes    Quit date: 11/09/1968    Years since quitting: 54.3   Smokeless tobacco: Never  Vaping Use   Vaping Use: Never used  Substance and Sexual Activity   Alcohol use: No    Alcohol/week: 0.0 standard drinks of alcohol   Drug use: No   Sexual activity: Not on file  Other Topics Concern   Not on file  Social History Narrative   Not on file   Social Determinants of Health   Financial Resource Strain: Not on file  Food Insecurity: Not on file  Transportation Needs: Not on file  Physical Activity: Not on file  Stress: Not on file  Social Connections: Not  on file     Family History:  The patient's family history includes Breast cancer (age of onset: 71) in her sister; Diabetes in her father; Hypertension in her father; Parkinson's disease in her mother.  ROS:   12-point review of systems is negative unless otherwise noted in the HPI.   EKGs/Labs/Other Studies Reviewed:    Studies reviewed were summarized above. The additional studies were reviewed today:  Zio patch 07/2022: Patient had a min HR of 43 bpm, max HR of 156 bpm, and avg HR of 60 bpm. Predominant underlying rhythm was Sinus Rhythm. 5 Supraventricular Tachycardia runs occurred, the run with the fastest interval lasting 10 beats with a max rate of 156 bpm (avg 107  bpm); the run with the fastest interval was also the longest. Isolated SVEs were rare (<1.0%), SVE Couplets were rare (<1.0%), and SVE Triplets were rare (<1.0%). Isolated VEs were rare (<1.0%), and no VE Couplets or VE Triplets were present.  __________   2D echo 08/28/2022: 1. Left ventricular ejection fraction, by estimation, is 60 to 65%. The   left ventricle has normal function. The left ventricle has no regional  wall motion abnormalities. Left ventricular diastolic parameters are  consistent with Grade I diastolic  dysfunction (impaired relaxation). The average left ventricular global  longitudinal strain is -22.2 %.   2. Right ventricular systolic function is normal. The right ventricular  size is normal. There is normal pulmonary artery systolic pressure. The  estimated right ventricular systolic pressure is 27.1 mmHg.   3. The mitral valve is normal in structure. Mild mitral valve  regurgitation. No evidence of mitral stenosis.   4. The aortic valve was not well visualized. Aortic valve regurgitation  is not visualized. No aortic stenosis is present.   5. The inferior vena cava is normal in size with greater than 50%  respiratory variability, suggesting right atrial pressure of 3 mmHg.   EKG:  EKG is ordered today.  The EKG ordered today demonstrates NSR, 60 bpm, early repolarization abnormality, no significant change when compared to prior tracing  Recent Labs: 07/03/2022: Magnesium 2.0; TSH 2.012 09/28/2022: ALT 14; BUN 11; Creatinine, Ser 0.84; Hemoglobin 13.9; Platelets 200; Potassium 4.4; Sodium 141  Recent Lipid Panel No results found for: "CHOL", "TRIG", "HDL", "CHOLHDL", "VLDL", "LDLCALC", "LDLDIRECT"  PHYSICAL EXAM:    VS:  BP (!) 142/86   Ht 5\' 1"  (1.549 m)   Wt 153 lb (69.4 kg)   BMI 28.91 kg/m   BMI: Body mass index is 28.91 kg/m.  Physical Exam Vitals reviewed.  Constitutional:      Appearance: She is well-developed.  HENT:     Head: Normocephalic and atraumatic.  Eyes:     General:        Right eye: No discharge.        Left eye: No discharge.  Neck:     Vascular: No JVD.  Cardiovascular:     Rate and Rhythm: Normal rate and regular rhythm.     Heart sounds: Normal heart sounds, S1 normal and S2 normal. Heart sounds not distant. No midsystolic click and no opening snap. No murmur heard.     No friction rub.  Pulmonary:     Effort: Pulmonary effort is normal. No respiratory distress.     Breath sounds: Normal breath sounds. No decreased breath sounds, wheezing or rales.  Chest:     Chest wall: No tenderness.  Abdominal:     General: There is no distension.  Musculoskeletal:  Cervical back: Normal range of motion.     Right lower leg: No edema.     Left lower leg: No edema.  Skin:    General: Skin is warm and dry.     Nails: There is no clubbing.  Neurological:     Mental Status: She is alert and oriented to person, place, and time.  Psychiatric:        Speech: Speech normal.        Behavior: Behavior normal.        Thought Content: Thought content normal.        Judgment: Judgment normal.     Wt Readings from Last 3 Encounters:  03/05/23 153 lb (69.4 kg)  09/28/22 154 lb 5.2 oz (70 kg)  09/07/22 154 lb 12.8 oz (70.2 kg)     ASSESSMENT & PLAN:   PAF: Maintaining sinus rhythm on metoprolol succinate 12.5 mg daily.  CHA2DS2-VASc at least 4 (HTN, age x 2, sex category).  She remains on apixaban 5 mg twice daily and does not meet reduced dosing criteria.  No symptoms concerning for bleeding.  Recent labs showed stable hemoglobin, electrolytes, and renal function.  PSVT: Quiescent.  Minimal burden noted on outpatient cardiac monitoring.  Metoprolol as outlined above.  Exertional dyspnea: Longstanding issue.  No frank angina.  Echo by outside office with preserved LV systolic function.  Schedule Lexiscan MPI to evaluate for high risk ischemia.  Syncope: No further episodes.  Will be vasovagal in the setting of straining for BM.  No evidence of high-grade AV block or prolonged pauses noted on outpatient cardiac monitoring.  Echo without significant structural abnormalities.  Carotid artery ultrasound in 11/2022 showed 50 to 69% LICA stenosis.  Schedule Lexiscan MPI.  HLD: On lovastatin.  Followed by PCP.   Carotid artery stenosis: 50 to 69% left ICA stenosis in  11/2022.  Remains on lovastatin as outlined above.  Followed by PCP.   Shared Decision Making/Informed Consent{  The risks [chest pain, shortness of breath, cardiac arrhythmias, dizziness, blood pressure fluctuations, myocardial infarction, stroke/transient ischemic attack, nausea, vomiting, allergic reaction, radiation exposure, metallic taste sensation and life-threatening complications (estimated to be 1 in 10,000)], benefits (risk stratification, diagnosing coronary artery disease, treatment guidance) and alternatives of a nuclear stress test were discussed in detail with Charlene Norris and she agrees to proceed.     Disposition: F/u with Dr. Mariah Milling or an APP in 3 months.   Medication Adjustments/Labs and Tests Ordered: Current medicines are reviewed at length with the patient today.  Concerns regarding medicines are outlined above. Medication changes, Labs and Tests ordered today are summarized above and listed in the Patient Instructions accessible in Encounters.   Signed, Eula Listen, PA-C 03/05/2023 12:17 PM     [facility] [place] [address] [place], [place] [address] [phone]

## 2023-03-05 ENCOUNTER — Encounter: Payer: Self-pay | Admitting: Physician Assistant

## 2023-03-05 ENCOUNTER — Ambulatory Visit: Payer: Medicare HMO | Attending: Physician Assistant | Admitting: Physician Assistant

## 2023-03-05 VITALS — BP 142/86 | Ht 61.0 in | Wt 153.0 lb

## 2023-03-05 DIAGNOSIS — I6522 Occlusion and stenosis of left carotid artery: Secondary | ICD-10-CM

## 2023-03-05 DIAGNOSIS — Z87898 Personal history of other specified conditions: Secondary | ICD-10-CM | POA: Diagnosis not present

## 2023-03-05 DIAGNOSIS — I471 Supraventricular tachycardia, unspecified: Secondary | ICD-10-CM | POA: Diagnosis not present

## 2023-03-05 DIAGNOSIS — R0609 Other forms of dyspnea: Secondary | ICD-10-CM

## 2023-03-05 DIAGNOSIS — I480 Paroxysmal atrial fibrillation: Secondary | ICD-10-CM

## 2023-03-05 DIAGNOSIS — M810 Age-related osteoporosis without current pathological fracture: Secondary | ICD-10-CM | POA: Insufficient documentation

## 2023-03-05 DIAGNOSIS — E785 Hyperlipidemia, unspecified: Secondary | ICD-10-CM

## 2023-03-05 NOTE — Patient Instructions (Signed)
Medication Instructions:  You may take Over the Counter Melatonin 1 mg or 3 mg nightly as needed for sleep or Unisom 0.5 tablet nightly as needed for sleep.  *If you need a refill on your cardiac medications before your next appointment, please call your pharmacy*   Lab Work: No labs ordered today.   Testing/Procedures: Your provider has ordered a Lexiscan/ Exercise Myoview Stress test. This will take place at [facility]. Please report to the [facility] medical mall entrance. The volunteers at the first desk will direct you where to go.  [facility] MYOVIEW  Your provider has ordered a Stress Test with nuclear imaging. The purpose of this test is to evaluate the blood supply to your heart muscle. This procedure is referred to as a "Non-Invasive Stress Test." This is because other than having an IV started in your vein, nothing is inserted or "invades" your body. Cardiac stress tests are done to find areas of poor blood flow to the heart by determining the extent of coronary artery disease (CAD). Some patients exercise on a treadmill, which naturally increases the blood flow to your heart, while others who are unable to walk on a treadmill due to physical limitations will have a pharmacologic/chemical stress agent called Lexiscan . This medicine will mimic walking on a treadmill by temporarily increasing your coronary blood flow.   Please note: these test may take anywhere between 2-4 hours to complete  How to prepare for your Myoview test:  Nothing to eat for 6 hours prior to the test No caffeine for 24 hours prior to test No smoking 24 hours prior to test. Your medication may be taken with water.  If your doctor stopped a medication because of this test, do not take that medication. Ladies, please do not wear dresses.  Skirts or pants are appropriate. Please wear a short sleeve shirt. No perfume, cologne or lotion. Wear comfortable walking shoes. No heels!   PLEASE NOTIFY THE OFFICE AT LEAST 24 HOURS IN  ADVANCE IF YOU ARE UNABLE TO KEEP YOUR APPOINTMENT.  [phone] AND  PLEASE NOTIFY NUCLEAR MEDICINE AT [facility] AT LEAST 24 HOURS IN ADVANCE IF YOU ARE UNABLE TO KEEP YOUR APPOINTMENT. [phone]    Follow-Up: At [facility], you and your health needs are our priority.  As part of our continuing mission to provide you with exceptional heart care, we have created designated Provider Care Teams.  These Care Teams include your primary Cardiologist (physician) and Advanced Practice Providers (APPs -  Physician Assistants and Nurse Practitioners) who all work together to provide you with the care you need, when you need it.  Your next appointment:   3 month(s)  Provider:   Julien Nordmann, MD or Eula Listen, PA-C

## 2023-03-12 ENCOUNTER — Encounter
Admission: RE | Admit: 2023-03-12 | Discharge: 2023-03-12 | Disposition: A | Discharge status: Home / Self Care | Payer: Medicare HMO | Source: Ambulatory Visit | Attending: Physician Assistant | Admitting: Physician Assistant

## 2023-03-12 DIAGNOSIS — R0609 Other forms of dyspnea: Secondary | ICD-10-CM | POA: Diagnosis present

## 2023-03-12 LAB — NM MYOCAR MULTI W/SPECT W/WALL MOTION / EF
Base ST Depression (mm): 0 mm
LV dias vol: 39 mL (ref 46–106)
LV sys vol: 16 mL
Nuc Stress EF: 59 %
Peak HR: 96 {beats}/min
Percent HR: 68 %
Rest HR: 57 {beats}/min
Rest Nuclear Isotope Dose: 10.2 mCi
SDS: 2
SRS: 3
SSS: 1
ST Depression (mm): 0 mm
Stress Nuclear Isotope Dose: 31.5 mCi
TID: 0.68

## 2023-03-12 MED ORDER — REGADENOSON 0.4 MG/5ML IV SOLN
0.4000 mg | Freq: Once | INTRAVENOUS | Status: AC
  Administered 2023-03-12: 0.4 mg via INTRAVENOUS

## 2023-03-12 MED ORDER — TECHNETIUM TC 99M TETROFOSMIN IV KIT
31.4600 | PACK | Freq: Once | INTRAVENOUS | Status: AC | PRN
  Administered 2023-03-12: 31.46 via INTRAVENOUS

## 2023-03-12 MED ORDER — TECHNETIUM TC 99M TETROFOSMIN IV KIT
10.0000 | PACK | Freq: Once | INTRAVENOUS | Status: AC | PRN
  Administered 2023-03-12: 10.24 via INTRAVENOUS

## 2023-05-31 LAB — HEPATIC FUNCTION PANEL
ALT: 13 U/L (ref 7–35)
AST: 19 (ref 13–35)
Alkaline Phosphatase: 46 (ref 25–125)
Bilirubin, Total: 0.5

## 2023-05-31 LAB — COMPREHENSIVE METABOLIC PANEL
Albumin: 4.5 (ref 3.5–5.0)
Calcium: 9.4 (ref 8.7–10.7)
Globulin: 2
eGFR: 74

## 2023-05-31 LAB — LIPID PANEL
Cholesterol: 154 (ref 0–200)
HDL: 70 (ref 35–70)
LDL Cholesterol: 59
LDl/HDL Ratio: 0.8
Triglycerides: 150 (ref 40–160)

## 2023-05-31 LAB — BASIC METABOLIC PANEL
BUN: 12 (ref 4–21)
CO2: 23 — AB (ref 13–22)
Chloride: 102 (ref 99–108)
Creatinine: 0.8 (ref 0.5–1.1)
Glucose: 101
Potassium: 4.7 meq/L (ref 3.5–5.1)
Sodium: 140 (ref 137–147)

## 2023-05-31 LAB — HEMOGLOBIN A1C: Hemoglobin A1C: 5.4

## 2023-06-08 ENCOUNTER — Ambulatory Visit: Payer: Medicare HMO | Attending: Physician Assistant | Admitting: Physician Assistant

## 2023-06-08 ENCOUNTER — Encounter: Payer: Self-pay | Admitting: Physician Assistant

## 2023-06-08 VITALS — BP 162/74 | HR 65 | Ht 61.0 in | Wt 156.0 lb

## 2023-06-08 DIAGNOSIS — I6522 Occlusion and stenosis of left carotid artery: Secondary | ICD-10-CM

## 2023-06-08 DIAGNOSIS — I480 Paroxysmal atrial fibrillation: Secondary | ICD-10-CM

## 2023-06-08 DIAGNOSIS — E785 Hyperlipidemia, unspecified: Secondary | ICD-10-CM | POA: Diagnosis not present

## 2023-06-08 DIAGNOSIS — Z87898 Personal history of other specified conditions: Secondary | ICD-10-CM

## 2023-06-08 DIAGNOSIS — I471 Supraventricular tachycardia, unspecified: Secondary | ICD-10-CM

## 2023-06-08 NOTE — Progress Notes (Signed)
Cardiology Office Note    Date:  06/08/2023   ID:  Charlene Norris, DOB December 31, 1940, MRN [id number]  PCP:  Alan Mulder, MD  Cardiologist:  Julien Nordmann, MD  Electrophysiologist:  None   Chief Complaint: Follow up  History of Present Illness:   Charlene Norris is a 82 y.o. female with history of PAF, pancreatitis, left-sided carotid artery stenosis, hiatal hernia, HTN, aortic atherosclerosis, HLD, prediabetes, and GERD who presents for follow-up of A-fib.   Remote EKG from 2010 showed A-fib with RVR.  She has previously not been maintained on anticoagulation.  EKG from 2016 showed sinus rhythm.  She has reported regular cardiac testing through her PCP's office including echocardiogram.  We do not have these available for review.   She was admitted to the hospital in 06/2022 with A-fib with RVR, syncopal episode, and sepsis secondary to UTI.  On the morning of 07/03/2022, she got up to have a BM and was straining.  This was associated with dizziness and sweating.  She got up to lay on the bed, rolled over, and suffered a syncopal episode.  This was not witnessed, however her husband did hear the episode.  She came to on the floor.  EMS was called where she was found to be in A-fib with RVR.  She denied any preceding chest pain or dyspnea.  She did note ongoing dizziness and unsteadiness.  In the ED, she had greater than 10 episodes of nonbilious, nonbloody emesis.  Presenting rhythm of A-fib with RVR in the 140s to 150s bpm.  Telemetry showed several episodes of prolonged R-R intervals of 4.7 to 4.8 seconds in the ED.  It was unclear if these episodes corresponded with her emesis.  High-sensitivity troponin negative x2.  Head CT showed no acute intracranial abnormality with low attenuation of the periventricular white matter presumed to be chronic microvascular ischemic changes.  MRI of the brain showed no evidence of recent infarction, hemorrhage, or mass with chronic microvascular ischemic  changes.  Following improvement of emesis, she had no further pauses/prolonged R-R intervals on telemetry.  She was placed on a diltiazem drip with spontaneous conversion to sinus rhythm.  Echo was ordered, though unable to be completed due to no echo tech available.  She was discharged on low-dose Toprol-XL and apixaban.  She was seen in hospital follow-up on 07/07/2022 and was doing well from a cardiac perspective, without symptoms of angina or decompensation.  She did note some weakness.  She was maintaining sinus rhythm.  Subsequent echo demonstrated an EF of 60 to 65%, no regional wall motion abnormalities, grade 1 diastolic dysfunction, normal RV systolic function, ventricular cavity size, and PASP, mild mitral regurgitation, and an estimated right atrial pressure of 3 mmHg.  Outpatient cardiac monitoring showed a predominant rhythm of sinus with an average rate of 60 bpm (range 43 to 156 bpm), 5 episodes of SVT with the fastest and longest interval lasting 10 beats with a maximum rate of 156 bpm, and rare atrial/ventricular ectopy.  Triggered events corresponded with normal sinus rhythm.     She was seen in the ED in 09/2022 with jaw pain and exertional dyspnea.  EKG nonacute.  High-sensitivity troponin negative x 2.  Chest x-ray showed no active disease.  She was subsequently found to have a deep cavity by her dentist.   Carotid artery ultrasound performed in 11/2022 through PCP's office showed 50 to 69% left ICA stenosis with no evidence of disease along the right ICA.  Echo performed at that time, through PCPs office, showed an EF greater than 55% with trace mitral valve regurgitation.  She was last seen in the office in 02/2023 and was without symptoms of angina or cardiac decompensation.  She noted stable longstanding exertional dyspnea and fatigue and reported she was more symptomatic following nights that she did not sleep well.  Lexiscan MPI on 03/12/2023 showed no evidence of significant ischemia  with an EF of 51% and was overall low risk.  CT attenuation corrected images showed mild aortic atherosclerosis.  She comes in accompanied by her husband today and is doing well from a cardiac perspective, without symptoms of angina or cardiac decompensation.  She continues to note difficulty sleeping and believes this is contributing to her elevated blood pressure today, indicating she did not sleep well last evening.  No dizziness, presyncope, or syncope.  Not currently checking blood pressure at home.  No falls or symptoms concerning for bleeding.   Labs independently reviewed: 09/2022 - Hgb 13.9, PLT 200, potassium 4.4, BUN 11, serum creatinine 0.84, albumin 4.2, AST/ALT normal 06/2022 - TSH normal, magnesium 2.0, A1c 4.9  Past Medical History:  Diagnosis Date   Arthritis    Cataract    GERD (gastroesophageal reflux disease)    Hyperlipidemia    Hypertension    PAF (paroxysmal atrial fibrillation) (HCC)    Pancreatitis    Pre-diabetes     Past Surgical History:  Procedure Laterality Date   ABDOMINAL HYSTERECTOMY     partial   CHOLECYSTECTOMY N/A 07/24/2015   Procedure: LAPAROSCOPIC CHOLECYSTECTOMY CONVERTED TO OPEN CHOLECYSTECTOMY ;  Surgeon: Kieth Brightly, MD;  Location: [facility] ORS;  Service: General;  Laterality: N/A;   ERCP N/A 12/30/2021   Procedure: ENDOSCOPIC RETROGRADE CHOLANGIOPANCREATOGRAPHY (ERCP);  Surgeon: Midge Minium, MD;  Location: [facility] ENDOSCOPY;  Service: Endoscopy;  Laterality: N/A;   EXPLORATORY LAPAROTOMY     JOINT REPLACEMENT Bilateral 2009, 2010   knee   PARATHYROID EXPLORATION  2012   growth/ Dr Willeen Cass   PARTIAL HYSTERECTOMY      Current Medications: No outpatient medications have been marked as taking for the 06/08/23 encounter (Office Visit) with Sondra Barges, PA-C.    Allergies:   Levofloxacin, Ampicillin, Celebrex [celecoxib], Nexium [esomeprazole magnesium], Ranitidine, and Zyrtec [cetirizine]   Social History   Socioeconomic History    Marital status: Married    Spouse name: Not on file   Number of children: Not on file   Years of education: Not on file   Highest education level: Not on file  Occupational History   Not on file  Tobacco Use   Smoking status: Former    Current packs/day: 0.00    Types: Cigarettes    Start date: 11/10/1963    Quit date: 11/09/1968    Years since quitting: 54.6   Smokeless tobacco: Never  Vaping Use   Vaping status: Never Used  Substance and Sexual Activity   Alcohol use: No    Alcohol/week: 0.0 standard drinks of alcohol   Drug use: No   Sexual activity: Not on file  Other Topics Concern   Not on file  Social History Narrative   Not on file   Social Determinants of Health   Financial Resource Strain: Not on file  Food Insecurity: Not on file  Transportation Needs: Not on file  Physical Activity: Not on file  Stress: Not on file  Social Connections: Not on file     Family History:  The patient's family history  includes Breast cancer (age of onset: 56) in her sister; Diabetes in her father; Hypertension in her father; Parkinson's disease in her mother.  ROS:   12-point review of systems is negative unless otherwise noted in the HPI.   EKGs/Labs/Other Studies Reviewed:    Studies reviewed were summarized above. The additional studies were reviewed today:  Zio patch 07/2022: Patient had a min HR of 43 bpm, max HR of 156 bpm, and avg HR of 60 bpm. Predominant underlying rhythm was Sinus Rhythm. 5 Supraventricular Tachycardia runs occurred, the run with the fastest interval lasting 10 beats with a max rate of 156 bpm (avg 107  bpm); the run with the fastest interval was also the longest. Isolated SVEs were rare (<1.0%), SVE Couplets were rare (<1.0%), and SVE Triplets were rare (<1.0%). Isolated VEs were rare (<1.0%), and no VE Couplets or VE Triplets were present.  __________   2D echo 08/28/2022: 1. Left ventricular ejection fraction, by estimation, is 60 to 65%. The   left ventricle has normal function. The left ventricle has no regional  wall motion abnormalities. Left ventricular diastolic parameters are  consistent with Grade I diastolic  dysfunction (impaired relaxation). The average left ventricular global  longitudinal strain is -22.2 %.   2. Right ventricular systolic function is normal. The right ventricular  size is normal. There is normal pulmonary artery systolic pressure. The  estimated right ventricular systolic pressure is 27.1 mmHg.   3. The mitral valve is normal in structure. Mild mitral valve  regurgitation. No evidence of mitral stenosis.   4. The aortic valve was not well visualized. Aortic valve regurgitation  is not visualized. No aortic stenosis is present.   5. The inferior vena cava is normal in size with greater than 50%  respiratory variability, suggesting right atrial pressure of 3 mmHg. __________  Eugenie Birks MPI 03/12/2023: Pharmacological myocardial perfusion imaging study with no significant ischemia Normal wall motion, EF estimated at 51% No EKG changes concerning for ischemia at peak stress or in recovery. CT attenuation correction images with mild aortic atherosclerosis Low risk scan   EKG:  EKG is ordered today.  The EKG ordered today demonstrates NSR, 65 bpm, LVH with early repolarization abnormalities   Recent Labs: 07/03/2022: Magnesium 2.0; TSH 2.012 09/28/2022: ALT 14; BUN 11; Creatinine, Ser 0.84; Hemoglobin 13.9; Platelets 200; Potassium 4.4; Sodium 141  Recent Lipid Panel No results found for: "CHOL", "TRIG", "HDL", "CHOLHDL", "VLDL", "LDLCALC", "LDLDIRECT"  PHYSICAL EXAM:    VS:  BP (!) 162/74 (BP Location: Right Arm, Patient Position: Sitting, Cuff Size: Normal)   Pulse 65   Ht 5\' 1"  (1.549 m)   Wt 156 lb (70.8 kg)   SpO2 99%   BMI 29.48 kg/m   BMI: Body mass index is 29.48 kg/m.  Physical Exam Vitals reviewed.  Constitutional:      Appearance: She is well-developed.  HENT:     Head:  Normocephalic and atraumatic.  Eyes:     General:        Right eye: No discharge.        Left eye: No discharge.  Neck:     Vascular: No JVD.  Cardiovascular:     Rate and Rhythm: Normal rate and regular rhythm.     Pulses:          Posterior tibial pulses are 2+ on the right side and 2+ on the left side.     Heart sounds: Normal heart sounds, S1 normal and S2 normal. Heart  sounds not distant. No midsystolic click and no opening snap. No murmur heard.    No friction rub.  Pulmonary:     Effort: Pulmonary effort is normal. No respiratory distress.     Breath sounds: Normal breath sounds. No decreased breath sounds, wheezing or rales.  Chest:     Chest wall: No tenderness.  Abdominal:     General: There is no distension.  Musculoskeletal:     Cervical back: Normal range of motion.     Right lower leg: No edema.     Left lower leg: No edema.  Skin:    General: Skin is warm and dry.     Nails: There is no clubbing.  Neurological:     Mental Status: She is alert and oriented to person, place, and time.  Psychiatric:        Speech: Speech normal.        Behavior: Behavior normal.        Thought Content: Thought content normal.        Judgment: Judgment normal.     Wt Readings from Last 3 Encounters:  06/08/23 156 lb (70.8 kg)  03/05/23 153 lb (69.4 kg)  09/28/22 154 lb 5.2 oz (70 kg)     ASSESSMENT & PLAN:   PAF: Maintaining sinus rhythm on metoprolol succinate 12.5 mg daily.  CHA2DS2-VASc at least 4 (HTN, age x 2, sex category).  She remains on apixaban 5 mg twice daily and does not meet reduced dosing criteria.  No symptoms concerning for bleeding.  Recent labs stable.  PSVT: Quiescent.  Remains on metoprolol as outlined above.  Aortic atherosclerosis/HLD: On lovastatin, managed by PCP.  History of syncope: No further episodes.  Felt to be vasovagal in the setting of straining for BM. No evidence of high-grade AV block or prolonged pauses noted on outpatient cardiac  monitoring. Echo without significant structural abnormalities. Carotid artery ultrasound in 11/2022 showed 50 to 69% LICA stenosis.  Lexiscan MPI in 03/2023 showed no evidence of high risk ischemia.  Carotid artery stenosis: 50 to 69% left ICA stenosis in 11/2022.  Remains on apixaban given A-fib and lovastatin as outlined above.  Followed by PCP.   Disposition: F/u with Dr. Mariah Milling or an APP in 6 months.   Medication Adjustments/Labs and Tests Ordered: Current medicines are reviewed at length with the patient today.  Concerns regarding medicines are outlined above. Medication changes, Labs and Tests ordered today are summarized above and listed in the Patient Instructions accessible in Encounters.   Signed, Eula Listen, PA-C 06/08/2023 9:50 AM     [facility] [address] [place], [place] [address] [phone]

## 2023-06-08 NOTE — Patient Instructions (Signed)
Medication Instructions:  No changes at this time.   *If you need a refill on your cardiac medications before your next appointment, please call your pharmacy*   Lab Work: None  If you have labs (blood work) drawn today and your tests are completely normal, you will receive your results only by: MyChart Message (if you have MyChart) OR A paper copy in the mail If you have any lab test that is abnormal or we need to change your treatment, we will call you to review the results.   Testing/Procedures: Please monitor blood pressures and keep a log of your readings.   Make sure to check 2 hours after your medications.   AVOID these things for 30 minutes before checking your blood pressure: No Drinking caffeine. No Drinking alcohol. No Eating. No Smoking. No Exercising.  Five minutes before checking your blood pressure: Pee. Sit in a dining chair. Avoid sitting in a soft couch or armchair. Be quiet. Do not talk.    Follow-Up: At [facility], you and your health needs are our priority.  As part of our continuing mission to provide you with exceptional heart care, we have created designated Provider Care Teams.  These Care Teams include your primary Cardiologist (physician) and Advanced Practice Providers (APPs -  Physician Assistants and Nurse Practitioners) who all work together to provide you with the care you need, when you need it.   Your next appointment:   6 month(s)  Provider:   Julien Nordmann, MD or Eula Listen, PA-C

## 2023-06-09 ENCOUNTER — Encounter: Payer: Self-pay | Admitting: Cardiology

## 2023-06-09 ENCOUNTER — Encounter: Payer: Self-pay | Admitting: Physician Assistant

## 2023-06-17 ENCOUNTER — Other Ambulatory Visit: Payer: Self-pay | Admitting: Physician Assistant

## 2023-06-28 ENCOUNTER — Telehealth: Payer: Self-pay | Admitting: Cardiovascular Disease

## 2023-06-28 NOTE — Telephone Encounter (Signed)
Pt c/o BP issue: STAT if pt c/o blurred vision, one-sided weakness or slurred speech  1. What are your last 5 BP readings?   8/6  BP 122/76  HR 57 - 9:00 am (pre meds done only once, all other were taken after meds)  8/7 BP 144/78  HR 57 11:00 am (after meds) 8/8 BP 133/75  HR 54  11:10 am 8/9 BP 120/83  HR 56, 11:00 am 8/10  BP 150/79  HR 53, 11:10 am 8/11  BP 1137/76  HR 58, 12:50 pm 8/12  BP 147/75  HR 54, 11:00 am 8/13  BP 139/78  HR 57, 11:15 am 8/14  BP 129/71  HR 56, 11:48 am 8/15  BP 123/70  HR 578, 11:20 am 8/16  BP 118/67  HR 58, 11:00 am 8/17  BP 136/74  HR 54, 11:20 am 8/18  BP 137/75  HR 55, 11:20 am 8/19  BP 149/75  HR 53, 11:20 am  2. Are you having any other symptoms (ex. Dizziness, headache, blurred vision, passed out)?   3. What is your BP issue?   Patient called to report BP readings as requested by R. Dunn.  Patient noted she does not get much sleep and the better numbers are when she gets a good night sleep.  Patient also wanted to add that she has pain in the right thigh area which may affect her BP readings as well.

## 2023-06-28 NOTE — Telephone Encounter (Signed)
Overall, blood pressures are reasonably controlled with a couple of higher readings which as she noted are likely in the setting of poor sleep at times and with thigh pain.  Based on these readings, I would not adjust medications and recommend she continue current medical therapy.  Thank you for the update.

## 2023-06-28 NOTE — Telephone Encounter (Signed)
Spoke with patient and informed her of PA-C Ryan Dunn's response as follows:  "Overall, blood pressures are reasonably controlled with a couple of higher readings which as she noted are likely in the setting of poor sleep at times and with thigh pain.  Based on these readings, I would not adjust medications and recommend she continue current medical therapy.  Thank you for the update"  Patient understood with read back

## 2023-07-16 ENCOUNTER — Ambulatory Visit: Payer: Medicare HMO | Admitting: Family Medicine

## 2023-07-19 DIAGNOSIS — M9904 Segmental and somatic dysfunction of sacral region: Secondary | ICD-10-CM | POA: Diagnosis not present

## 2023-07-19 DIAGNOSIS — M62830 Muscle spasm of back: Secondary | ICD-10-CM | POA: Diagnosis not present

## 2023-07-19 DIAGNOSIS — M5432 Sciatica, left side: Secondary | ICD-10-CM | POA: Diagnosis not present

## 2023-07-19 DIAGNOSIS — M9903 Segmental and somatic dysfunction of lumbar region: Secondary | ICD-10-CM | POA: Diagnosis not present

## 2023-07-28 ENCOUNTER — Ambulatory Visit: Payer: Medicare HMO | Admitting: Family Medicine

## 2023-08-02 DIAGNOSIS — M62830 Muscle spasm of back: Secondary | ICD-10-CM | POA: Diagnosis not present

## 2023-08-02 DIAGNOSIS — M9904 Segmental and somatic dysfunction of sacral region: Secondary | ICD-10-CM | POA: Diagnosis not present

## 2023-08-02 DIAGNOSIS — M9903 Segmental and somatic dysfunction of lumbar region: Secondary | ICD-10-CM | POA: Diagnosis not present

## 2023-08-02 DIAGNOSIS — M5432 Sciatica, left side: Secondary | ICD-10-CM | POA: Diagnosis not present

## 2023-08-06 DIAGNOSIS — G479 Sleep disorder, unspecified: Secondary | ICD-10-CM | POA: Insufficient documentation

## 2023-08-09 ENCOUNTER — Ambulatory Visit: Payer: Medicare HMO | Admitting: Family Medicine

## 2023-08-09 ENCOUNTER — Encounter: Payer: Self-pay | Admitting: Family Medicine

## 2023-08-09 VITALS — BP 167/79 | HR 60 | Temp 98.6°F | Ht 61.0 in | Wt 154.0 lb

## 2023-08-09 DIAGNOSIS — I10 Essential (primary) hypertension: Secondary | ICD-10-CM

## 2023-08-09 DIAGNOSIS — M5430 Sciatica, unspecified side: Secondary | ICD-10-CM | POA: Diagnosis not present

## 2023-08-09 DIAGNOSIS — M549 Dorsalgia, unspecified: Secondary | ICD-10-CM

## 2023-08-09 DIAGNOSIS — Z23 Encounter for immunization: Secondary | ICD-10-CM

## 2023-08-09 DIAGNOSIS — E785 Hyperlipidemia, unspecified: Secondary | ICD-10-CM

## 2023-08-09 DIAGNOSIS — E663 Overweight: Secondary | ICD-10-CM | POA: Diagnosis not present

## 2023-08-09 DIAGNOSIS — E119 Type 2 diabetes mellitus without complications: Secondary | ICD-10-CM | POA: Diagnosis not present

## 2023-08-09 DIAGNOSIS — I6522 Occlusion and stenosis of left carotid artery: Secondary | ICD-10-CM

## 2023-08-09 DIAGNOSIS — M810 Age-related osteoporosis without current pathological fracture: Secondary | ICD-10-CM | POA: Diagnosis not present

## 2023-08-09 DIAGNOSIS — Z96653 Presence of artificial knee joint, bilateral: Secondary | ICD-10-CM

## 2023-08-09 DIAGNOSIS — M159 Polyosteoarthritis, unspecified: Secondary | ICD-10-CM

## 2023-08-09 DIAGNOSIS — I480 Paroxysmal atrial fibrillation: Secondary | ICD-10-CM | POA: Diagnosis not present

## 2023-08-09 DIAGNOSIS — R7303 Prediabetes: Secondary | ICD-10-CM

## 2023-08-09 DIAGNOSIS — Z8719 Personal history of other diseases of the digestive system: Secondary | ICD-10-CM | POA: Insufficient documentation

## 2023-08-09 DIAGNOSIS — E559 Vitamin D deficiency, unspecified: Secondary | ICD-10-CM

## 2023-08-09 DIAGNOSIS — E11A Type 2 diabetes mellitus without complications in remission: Secondary | ICD-10-CM

## 2023-08-09 NOTE — Progress Notes (Unsigned)
New patient visit   Patient: Charlene Norris   DOB: 1941/09/12   82 y.o. Female  MRN: [id number] Visit Date: 08/09/2023  Today's healthcare provider: Sherlyn Hay, DO   No chief complaint on file.  Subjective    Charlene Norris is a 82 y.o. female who presents today as a new patient to establish care.  HPI  Charlene Norris is a 82 y.o. female with history of PAF, pancreatitis, left-sided carotid artery stenosis, hiatal hernia, HTN, aortic atherosclerosis, HLD, prediabetes, and GERD who presents to establish care.  She was a previous patient of Dr. Patrecia Pace. She follows with cardiology.  Home BPs in August 2024 consistently 120s-130s/70s with a couple higher readings (max SBP 150) Problems sleeping - skipped night time snack recently and has been sleeping 6-7 hours lately.  - Previously, she was only sleeping 2-3 hours a night.  Pulled a muscle in right leg.  - has not been exercising as much lately due to recuperating. Most recent [place] [employer]; she could walk around for 20 minutes before becoming fatigued and short of breath and having to sit down.  Follows diet low in fat and eats a lot of fruit and vegetables. Eats some beef (lean) and some poultry. Feels she has been eating more salt than normal.   Still having some RUQ pain almost daily; does resolve on its own when she watches her diet better.  Using vicks vaporub for toenail fungus  Intermittently short of breath; sometimes occurs when getting dressed and then walking into the living room. None when walking from the waiting room today.   Past Medical History:  Diagnosis Date   Allergy ?   Years ago and one recently.   Anxiety ?   Off and on for years   Arthritis    Blood transfusion without reported diagnosis Many years ago   Following an operation   Cataract    GERD (gastroesophageal reflux disease)    Hyperlipidemia    Hypertension    PAF (paroxysmal atrial fibrillation) (HCC)    Pancreatitis     Pre-diabetes    Substance abuse (HCC) ?   About 40 or so years ago   Past Surgical History:  Procedure Laterality Date   ABDOMINAL HYSTERECTOMY     partial   CHOLECYSTECTOMY N/A 07/24/2015   Procedure: LAPAROSCOPIC CHOLECYSTECTOMY CONVERTED TO OPEN CHOLECYSTECTOMY ;  Surgeon: Kieth Brightly, MD;  Location: [facility] ORS;  Service: General;  Laterality: N/A;   ERCP N/A 12/30/2021   Procedure: ENDOSCOPIC RETROGRADE CHOLANGIOPANCREATOGRAPHY (ERCP);  Surgeon: Midge Minium, MD;  Location: [facility] END[facility]COPY;  Service: Endoscopy;  Laterality: N/A;   EXPLORATORY LAPAROTOMY     JOINT REPLACEMENT Bilateral 2009, 2010   knee   PARATHYROID EXPLORATION  2012   growth/ Dr Willeen Cass   PARTIAL HYSTERECTOMY     Family Status  Relation Name Status   Mother  Deceased   Father  Deceased   Sister  (Not Specified)  No partnership data on file   Family History  Problem Relation Age of Onset   Arthritis Mother    Parkinson's disease Mother    Diabetes Father    Hypertension Father    Stroke Father    Breast cancer Sister 35       DCIS   Social History   Socioeconomic History   Marital status: Married    Spouse name: Not on file   Number of children: Not on file   Years of education:  Not on file   Highest education level: 12th grade  Occupational History   Not on file  Tobacco Use   Smoking status: Former    Current packs/day: 0.00    Types: Cigarettes    Start date: 11/10/1963    Quit date: 11/09/1968    Years since quitting: 54.7   Smokeless tobacco: Never   Tobacco comments:    Quit smoking about 35 years ago.  Vaping Use   Vaping status: Never Used  Substance and Sexual Activity   Alcohol use: No   Drug use: No   Sexual activity: Yes    Birth control/protection: None    Comment: I'm 82. Don't need. Also had a partial hysterectomy many yea  Other Topics Concern   Not on file  Social History Narrative   Not on file   Social Determinants of Health   Financial Resource Strain:  Low Risk  (08/06/2023)   Overall Financial Resource Strain (CARDIA)    Difficulty of Paying Living Expenses: Not very hard  Food Insecurity: No Food Insecurity (08/06/2023)   Hunger Vital Sign    Worried About Running Out of Food in the Last Year: Never true    Ran Out of Food in the Last Year: Never true  Transportation Needs: No Transportation Needs (08/06/2023)   PRAPARE - Administrator, Civil Service (Medical): No    Lack of Transportation (Non-Medical): No  Physical Activity: Unknown (08/06/2023)   Exercise Vital Sign    Days of Exercise per Week: 0 days    Minutes of Exercise per Session: Not on file  Stress: Stress Concern Present (08/06/2023)   [employer] of Occupational Health - Occupational Stress Questionnaire    Feeling of Stress : To some extent  Social Connections: Moderately Integrated (08/06/2023)   Social Connection and Isolation Panel [NHANES]    Frequency of Communication with Friends and Family: More than three times a week    Frequency of Social Gatherings with Friends and Family: Once a week    Attends Religious Services: More than 4 times per year    Active Member of [employer] or Organizations: No    Attends Engineer, structural: Not on file    Marital Status: Married   Outpatient Medications Prior to Visit  Medication Sig   denosumab (PROLIA) 60 MG/ML SOSY injection Inject 60 mg into the skin every 6 (six) months. Most recent injection 03/15/2023   ELIQUIS 5 MG TABS tablet TAKE 1 TABLET(5 MG) BY MOUTH TWICE DAILY   lisinopril (ZESTRIL) 10 MG tablet Take 10 mg by mouth daily.   lovastatin (MEVACOR) 40 MG tablet Take 40 mg by mouth in the morning.   metoprolol succinate (TOPROL-XL) 25 MG 24 hr tablet TAKE 1/2 TABLET(12.5 MG) BY MOUTH DAILY   Vitamin D, Ergocalciferol, (DRISDOL) 1.25 MG (50000 UNIT) CAPS capsule Take 50,000 Units by mouth every 7 (seven) days.   [DISCONTINUED] lisinopril (ZESTRIL) 5 MG tablet Take 1 tablet (5 mg total) by  mouth daily.   No facility-administered medications prior to visit.   Allergies  Allergen Reactions   Levofloxacin Shortness Of Breath    Chest pressure   Ampicillin Itching   Celebrex [Celecoxib] Hives   Nexium [Esomeprazole Magnesium] Hives   Ranitidine Itching   Zyrtec [Cetirizine] Other (See Comments)    Feel ill    Immunization History  Administered Date(s) Administered   PFIZER(Purple Top)SARS-COV-2 Vaccination 12/01/2019, 12/19/2019, 08/24/2020    Health Maintenance  Topic Date Due  Medicare Annual Wellness (AWV)  Never done   DTaP/Tdap/Td (1 - Tdap) Never done   Zoster Vaccines- Shingrix (1 of 2) Never done   Pneumonia Vaccine 70+ Years old (1 of 1 - PCV) Never done   DEXA SCAN  Never done   INFLUENZA VACCINE  Never done   COVID-19 Vaccine (4 - 2023-24 season) 07/11/2023   HPV VACCINES  Aged Out    Patient Care Team: Varonica Siharath, Monico Blitz, DO as PCP - General (Family Medicine) Antonieta Iba, MD as PCP - Cardiology (Cardiology) Kieth Brightly, MD as Consulting Physician (General Surgery) Morayati, Delsa Sale, MD as Attending Physician (Endocrinology)  Review of Systems  Constitutional:  Negative for appetite change, chills, fatigue and fever.  Respiratory:  Negative for chest tightness and shortness of breath.   Cardiovascular:  Negative for chest pain and palpitations.  Gastrointestinal:  Negative for abdominal pain, nausea and vomiting.  Neurological:  Negative for dizziness and weakness.    {Insert previous labs (optional):23779} {See past labs  Heme  Chem  Endocrine  Serology  Results Review (optional):1}   Objective    BP (!) 167/79 (BP Location: Right Arm, Patient Position: Sitting, Cuff Size: Normal)   Pulse 60   Temp 98.6 F (37 C) (Oral)   Ht 5\' 1"  (1.549 m)   Wt 154 lb (69.9 kg)   SpO2 (!) 18%   BMI 29.10 kg/m  {Insert last BP/Wt (optional):23777}{See vitals history (optional):1}   Physical Exam   Depression Screen     08/09/2023    1:34 PM  PHQ 2/9 Scores  PHQ - 2 Score 0  PHQ- 9 Score 7   No results found for any visits on 08/09/23.  Assessment & Plan     Paroxysmal atrial fibrillation [facility]) Assessment & Plan: Managed by cardiology; will defer to their management. Continue Eliquis 5 mg twice daily. Continue metoprolol succinate 12.5 mg daily   History of pancreatitis  Carotid artery stenosis, unilateral, left  Primary hypertension Assessment & Plan: Continue lisinopril 10 mg daily and metoprolol 12.5 mg daily   Hyperlipidemia, unspecified hyperlipidemia type Assessment & Plan: Continue lovastatin 40 mg daily   Pre-diabetes  Age-related osteoporosis without current pathological fracture -     Ambulatory referral to Endocrinology     No follow-ups on file.     I discussed the assessment and treatment plan with the patient  The patient was provided an opportunity to ask questions and all were answered. The patient agreed with the plan and demonstrated an understanding of the instructions.   The patient was advised to call back or seek an in-person evaluation if the symptoms worsen or if the condition fails to improve as anticipated.    Sherlyn Hay, DO  [facility] [phone] (phone) [phone] (fax)  [facility]

## 2023-08-09 NOTE — Assessment & Plan Note (Signed)
Intermittently attentive to carbohydrate intake, particularly because carbs seem to help calm her RUQ. Will check blood work on next visit.

## 2023-08-09 NOTE — Assessment & Plan Note (Addendum)
Continue lisinopril 10 mg daily and metoprolol 12.5 mg daily Home BPs in August 2024 consistently 120s-130s/70s with a couple higher readings (max SBP 150)

## 2023-08-09 NOTE — Assessment & Plan Note (Signed)
Continue lovastatin 40 mg daily. 

## 2023-08-09 NOTE — Assessment & Plan Note (Addendum)
Managed by cardiology; will defer to their management. Continue Eliquis 5 mg twice daily. Continue metoprolol succinate 12.5 mg daily

## 2023-08-09 NOTE — Assessment & Plan Note (Signed)
Currently on vitamin D 50,000 units once per week (down from 2 capsules per week)

## 2023-08-12 ENCOUNTER — Encounter: Payer: Self-pay | Admitting: Family Medicine

## 2023-08-12 DIAGNOSIS — Z96653 Presence of artificial knee joint, bilateral: Secondary | ICD-10-CM | POA: Insufficient documentation

## 2023-08-12 DIAGNOSIS — E663 Overweight: Secondary | ICD-10-CM | POA: Insufficient documentation

## 2023-08-12 DIAGNOSIS — M5430 Sciatica, unspecified side: Secondary | ICD-10-CM | POA: Insufficient documentation

## 2023-08-12 DIAGNOSIS — M159 Polyosteoarthritis, unspecified: Secondary | ICD-10-CM | POA: Insufficient documentation

## 2023-08-12 NOTE — Assessment & Plan Note (Signed)
No acute concerns.  Continue to monitor.

## 2023-08-12 NOTE — Assessment & Plan Note (Signed)
Counseled patient on diet and exercise.  She will be increasing her activity as her leg pain improves.

## 2023-08-12 NOTE — Assessment & Plan Note (Addendum)
Will refer to endocrinology for continued Prolia injections.

## 2023-08-19 ENCOUNTER — Ambulatory Visit: Payer: Medicare HMO | Admitting: Family Medicine

## 2023-08-19 ENCOUNTER — Encounter: Payer: Self-pay | Admitting: Family Medicine

## 2023-08-23 DIAGNOSIS — M62830 Muscle spasm of back: Secondary | ICD-10-CM | POA: Diagnosis not present

## 2023-08-23 DIAGNOSIS — M9903 Segmental and somatic dysfunction of lumbar region: Secondary | ICD-10-CM | POA: Diagnosis not present

## 2023-08-23 DIAGNOSIS — M9904 Segmental and somatic dysfunction of sacral region: Secondary | ICD-10-CM | POA: Diagnosis not present

## 2023-08-23 DIAGNOSIS — M5432 Sciatica, left side: Secondary | ICD-10-CM | POA: Diagnosis not present

## 2023-08-24 ENCOUNTER — Other Ambulatory Visit: Payer: Self-pay | Admitting: Cardiovascular Disease

## 2023-08-24 NOTE — Telephone Encounter (Signed)
Please review

## 2023-08-24 NOTE — Telephone Encounter (Signed)
Prescription refill request for Eliquis received. Indication: PAF Last office visit: 06/08/23  R Dunn PA-C Scr: 0.8 on 05/31/23  Epic Age: 82 Weight: 70.8kg  Based on above findings Eliquis 5mg  twice daily is the appropriate dose.  Refill approved.

## 2023-09-12 ENCOUNTER — Other Ambulatory Visit: Payer: Self-pay | Admitting: Physician Assistant

## 2023-09-13 DIAGNOSIS — M62830 Muscle spasm of back: Secondary | ICD-10-CM | POA: Diagnosis not present

## 2023-09-13 DIAGNOSIS — M9904 Segmental and somatic dysfunction of sacral region: Secondary | ICD-10-CM | POA: Diagnosis not present

## 2023-09-13 DIAGNOSIS — M9903 Segmental and somatic dysfunction of lumbar region: Secondary | ICD-10-CM | POA: Diagnosis not present

## 2023-09-13 DIAGNOSIS — M5432 Sciatica, left side: Secondary | ICD-10-CM | POA: Diagnosis not present

## 2023-09-16 ENCOUNTER — Telehealth: Payer: Self-pay | Admitting: Family Medicine

## 2023-09-16 NOTE — Telephone Encounter (Signed)
Pt is calling to ask if referral to Raj Janus, MD is in network. She never heard anything. Please advise Cb- [phone]

## 2023-09-22 ENCOUNTER — Encounter: Payer: Self-pay | Admitting: Family Medicine

## 2023-09-22 DIAGNOSIS — M810 Age-related osteoporosis without current pathological fracture: Secondary | ICD-10-CM

## 2023-09-27 DIAGNOSIS — M8580 Other specified disorders of bone density and structure, unspecified site: Secondary | ICD-10-CM | POA: Diagnosis not present

## 2023-09-27 DIAGNOSIS — E559 Vitamin D deficiency, unspecified: Secondary | ICD-10-CM | POA: Diagnosis not present

## 2023-09-28 ENCOUNTER — Ambulatory Visit: Payer: Medicare HMO

## 2023-09-29 ENCOUNTER — Other Ambulatory Visit: Payer: Self-pay | Admitting: Family Medicine

## 2023-09-29 DIAGNOSIS — I10 Essential (primary) hypertension: Secondary | ICD-10-CM

## 2023-09-30 DIAGNOSIS — H43393 Other vitreous opacities, bilateral: Secondary | ICD-10-CM | POA: Diagnosis not present

## 2023-09-30 DIAGNOSIS — H2513 Age-related nuclear cataract, bilateral: Secondary | ICD-10-CM | POA: Diagnosis not present

## 2023-09-30 DIAGNOSIS — H1045 Other chronic allergic conjunctivitis: Secondary | ICD-10-CM | POA: Diagnosis not present

## 2023-10-04 DIAGNOSIS — M5432 Sciatica, left side: Secondary | ICD-10-CM | POA: Diagnosis not present

## 2023-10-04 DIAGNOSIS — M9904 Segmental and somatic dysfunction of sacral region: Secondary | ICD-10-CM | POA: Diagnosis not present

## 2023-10-04 DIAGNOSIS — M9903 Segmental and somatic dysfunction of lumbar region: Secondary | ICD-10-CM | POA: Diagnosis not present

## 2023-10-04 DIAGNOSIS — M62830 Muscle spasm of back: Secondary | ICD-10-CM | POA: Diagnosis not present

## 2023-10-11 DIAGNOSIS — M9904 Segmental and somatic dysfunction of sacral region: Secondary | ICD-10-CM | POA: Diagnosis not present

## 2023-10-11 DIAGNOSIS — M62830 Muscle spasm of back: Secondary | ICD-10-CM | POA: Diagnosis not present

## 2023-10-11 DIAGNOSIS — M9903 Segmental and somatic dysfunction of lumbar region: Secondary | ICD-10-CM | POA: Diagnosis not present

## 2023-10-11 DIAGNOSIS — M5432 Sciatica, left side: Secondary | ICD-10-CM | POA: Diagnosis not present

## 2023-11-01 DIAGNOSIS — M9903 Segmental and somatic dysfunction of lumbar region: Secondary | ICD-10-CM | POA: Diagnosis not present

## 2023-11-01 DIAGNOSIS — M9904 Segmental and somatic dysfunction of sacral region: Secondary | ICD-10-CM | POA: Diagnosis not present

## 2023-11-01 DIAGNOSIS — M5432 Sciatica, left side: Secondary | ICD-10-CM | POA: Diagnosis not present

## 2023-11-01 DIAGNOSIS — M62830 Muscle spasm of back: Secondary | ICD-10-CM | POA: Diagnosis not present

## 2023-11-08 ENCOUNTER — Telehealth: Payer: Self-pay | Admitting: Family Medicine

## 2023-11-08 DIAGNOSIS — M62830 Muscle spasm of back: Secondary | ICD-10-CM | POA: Diagnosis not present

## 2023-11-08 DIAGNOSIS — M5432 Sciatica, left side: Secondary | ICD-10-CM | POA: Diagnosis not present

## 2023-11-08 DIAGNOSIS — M9903 Segmental and somatic dysfunction of lumbar region: Secondary | ICD-10-CM | POA: Diagnosis not present

## 2023-11-08 DIAGNOSIS — M9904 Segmental and somatic dysfunction of sacral region: Secondary | ICD-10-CM | POA: Diagnosis not present

## 2023-11-08 NOTE — Telephone Encounter (Signed)
Pt is calling in because the pharmacy told her that in order for them to fill lovastatin (MEVACOR) 40 MG tablet [[id number]] they would need a new prescription sent in from pt's PCP. Pt says the pharmacy can be reached at [phone].

## 2023-11-09 ENCOUNTER — Other Ambulatory Visit: Payer: Self-pay

## 2023-11-09 MED ORDER — LOVASTATIN 40 MG PO TABS: 40.0000 mg | ORAL_TABLET | Freq: Every morning | ORAL | 0 refills | Status: DC

## 2023-11-15 DIAGNOSIS — M62830 Muscle spasm of back: Secondary | ICD-10-CM | POA: Diagnosis not present

## 2023-11-15 DIAGNOSIS — M9904 Segmental and somatic dysfunction of sacral region: Secondary | ICD-10-CM | POA: Diagnosis not present

## 2023-11-15 DIAGNOSIS — M5432 Sciatica, left side: Secondary | ICD-10-CM | POA: Diagnosis not present

## 2023-11-15 DIAGNOSIS — M9903 Segmental and somatic dysfunction of lumbar region: Secondary | ICD-10-CM | POA: Diagnosis not present

## 2023-11-19 DIAGNOSIS — M9903 Segmental and somatic dysfunction of lumbar region: Secondary | ICD-10-CM | POA: Diagnosis not present

## 2023-11-19 DIAGNOSIS — M5432 Sciatica, left side: Secondary | ICD-10-CM | POA: Diagnosis not present

## 2023-11-19 DIAGNOSIS — M9904 Segmental and somatic dysfunction of sacral region: Secondary | ICD-10-CM | POA: Diagnosis not present

## 2023-11-19 DIAGNOSIS — M62830 Muscle spasm of back: Secondary | ICD-10-CM | POA: Diagnosis not present

## 2023-11-22 DIAGNOSIS — M9903 Segmental and somatic dysfunction of lumbar region: Secondary | ICD-10-CM | POA: Diagnosis not present

## 2023-11-22 DIAGNOSIS — M9904 Segmental and somatic dysfunction of sacral region: Secondary | ICD-10-CM | POA: Diagnosis not present

## 2023-11-22 DIAGNOSIS — M62830 Muscle spasm of back: Secondary | ICD-10-CM | POA: Diagnosis not present

## 2023-11-22 DIAGNOSIS — M5432 Sciatica, left side: Secondary | ICD-10-CM | POA: Diagnosis not present

## 2023-11-24 DIAGNOSIS — M9904 Segmental and somatic dysfunction of sacral region: Secondary | ICD-10-CM | POA: Diagnosis not present

## 2023-11-24 DIAGNOSIS — M62830 Muscle spasm of back: Secondary | ICD-10-CM | POA: Diagnosis not present

## 2023-11-24 DIAGNOSIS — M9903 Segmental and somatic dysfunction of lumbar region: Secondary | ICD-10-CM | POA: Diagnosis not present

## 2023-11-24 DIAGNOSIS — M5432 Sciatica, left side: Secondary | ICD-10-CM | POA: Diagnosis not present

## 2023-11-29 DIAGNOSIS — M62830 Muscle spasm of back: Secondary | ICD-10-CM | POA: Diagnosis not present

## 2023-11-29 DIAGNOSIS — M9904 Segmental and somatic dysfunction of sacral region: Secondary | ICD-10-CM | POA: Diagnosis not present

## 2023-11-29 DIAGNOSIS — M5432 Sciatica, left side: Secondary | ICD-10-CM | POA: Diagnosis not present

## 2023-11-29 DIAGNOSIS — M9903 Segmental and somatic dysfunction of lumbar region: Secondary | ICD-10-CM | POA: Diagnosis not present

## 2023-11-30 DIAGNOSIS — M8580 Other specified disorders of bone density and structure, unspecified site: Secondary | ICD-10-CM | POA: Diagnosis not present

## 2023-12-06 ENCOUNTER — Encounter: Payer: Self-pay | Admitting: Family Medicine

## 2023-12-06 ENCOUNTER — Ambulatory Visit: Payer: Medicare HMO | Admitting: Family Medicine

## 2023-12-06 VITALS — BP 171/65 | HR 64 | Resp 18 | Ht 61.0 in | Wt 146.0 lb

## 2023-12-06 DIAGNOSIS — E559 Vitamin D deficiency, unspecified: Secondary | ICD-10-CM | POA: Diagnosis not present

## 2023-12-06 DIAGNOSIS — Z1231 Encounter for screening mammogram for malignant neoplasm of breast: Secondary | ICD-10-CM

## 2023-12-06 DIAGNOSIS — E785 Hyperlipidemia, unspecified: Secondary | ICD-10-CM | POA: Diagnosis not present

## 2023-12-06 DIAGNOSIS — E1169 Type 2 diabetes mellitus with other specified complication: Secondary | ICD-10-CM | POA: Diagnosis not present

## 2023-12-06 DIAGNOSIS — I480 Paroxysmal atrial fibrillation: Secondary | ICD-10-CM | POA: Diagnosis not present

## 2023-12-06 DIAGNOSIS — R932 Abnormal findings on diagnostic imaging of liver and biliary tract: Secondary | ICD-10-CM

## 2023-12-06 DIAGNOSIS — E119 Type 2 diabetes mellitus without complications: Secondary | ICD-10-CM | POA: Diagnosis not present

## 2023-12-06 DIAGNOSIS — M25551 Pain in right hip: Secondary | ICD-10-CM | POA: Diagnosis not present

## 2023-12-06 DIAGNOSIS — Z0000 Encounter for general adult medical examination without abnormal findings: Secondary | ICD-10-CM | POA: Insufficient documentation

## 2023-12-06 DIAGNOSIS — I10 Essential (primary) hypertension: Secondary | ICD-10-CM | POA: Diagnosis not present

## 2023-12-06 DIAGNOSIS — G8929 Other chronic pain: Secondary | ICD-10-CM | POA: Diagnosis not present

## 2023-12-06 DIAGNOSIS — Z0001 Encounter for general adult medical examination with abnormal findings: Secondary | ICD-10-CM

## 2023-12-06 DIAGNOSIS — M810 Age-related osteoporosis without current pathological fracture: Secondary | ICD-10-CM

## 2023-12-06 MED ORDER — PREDNISONE 20 MG PO TABS: ORAL_TABLET | ORAL | 0 refills | Status: DC

## 2023-12-06 MED ORDER — LISINOPRIL 20 MG PO TABS: 20.0000 mg | ORAL_TABLET | Freq: Every day | ORAL | 3 refills | Status: DC

## 2023-12-06 NOTE — Patient Instructions (Addendum)
Recommend Shingrix (shingles), Covid booster and Prevnar-20 (or -21) (for pneumonia)  Please have you ophthalmologist send [place] a copy of your most recent eye exam.

## 2023-12-06 NOTE — Progress Notes (Unsigned)
Complete physical exam   Patient: Charlene Norris   DOB: Sep 14, 1941   83 y.o. Female  MRN: [id number] Visit Date: 12/06/2023  Today's healthcare provider: Sherlyn Hay, DO   Chief Complaint  Patient presents with   Annual Exam   Subjective    Charlene Norris is a 83 y.o. female who presents today for a complete physical exam.  She reports consuming a general diet. The patient does not participate in regular exercise at present. She generally feels fairly well. She reports sleeping poorly. She does have additional problems to discuss today.  HPI  FOOT EXAM  PHQ-9 score 11  Right hip pain x3 mo  DEXA scan  last done Dec 2023  BP at home 129-164/71-83  The patient, with a history of atrial fibrillation and pancreatitis, presents with a chief complaint of hip pain, initially thought to be sciatica. The pain, located in the hip and radiating down the leg, has been ongoing for approximately three months. The patient reports that the pain is severe enough to limit mobility and daily activities, leading to a sedentary lifestyle. The patient has been managing the pain with over-the-counter Tylenol and Bufferin, but reports minimal relief.  The patient also reports shortness of breath, which she attributes to lack of sleep and constant pain. She has noticed a correlation between good sleep and improved blood pressure readings. The patient has a history of mini-strokes and has been experiencing headaches, which she believes are sinus-related. However, she expresses concern about the headaches located at the back of the head, fearing they might be indicative of more mini-strokes.  The patient has been managing her atrial fibrillation with Eliquis and her cholesterol with Lovastatin. She has also been on a special diet due to pancreatitis, which has resulted in significant weight loss. The patient has been receiving Prolia shots for osteoporosis and has recently seen an endocrinologist for  the same.  The patient has a history of receiving regular mammograms, with the last one performed in 2023. She has a family history of breast cancer, with a sister who had a growth removed in her thirties or forties. The patient also reports a history of receiving regular vaccines, including flu and shingles, but has missed the last shingles vaccine.  The patient has been seeing a chiropractor for her hip pain, who expressed concern that the pain might be hip-related rather than sciatica. The patient has not had an x-ray of the hip to confirm this. The patient also reports a fungal infection in her toenails, which she has been managing with Vicks VapoRub. ***  Past Medical History:  Diagnosis Date   Allergy ?   Years ago and one recently.   Anxiety ?   Off and on for years   Arthritis    Atrial fibrillation with RVR (HCC) 07/03/2022   Blood transfusion without reported diagnosis Many years ago   Following an operation   Calculus of bile duct without cholecystitis with obstruction    Cataract    Cholecystitis with cholelithiasis 07/24/2015   GERD (gastroesophageal reflux disease)    Hyperlipidemia    Hypertension    Hypokalemia    Nausea & vomiting    PAF (paroxysmal atrial fibrillation) (HCC)    Pancreatitis    Pre-diabetes    Sepsis (HCC)    Substance abuse (HCC) ?   About 40 or so years ago   UTI (urinary tract infection)    Past Surgical History:  Procedure Laterality Date  ABDOMINAL HYSTERECTOMY     partial   APPENDECTOMY N/A    CHOLECYSTECTOMY N/A 07/24/2015   Procedure: LAPAROSCOPIC CHOLECYSTECTOMY CONVERTED TO OPEN CHOLECYSTECTOMY ;  Surgeon: Kieth Brightly, MD;  Location: [facility] ORS;  Service: General;  Laterality: N/A;   ERCP N/A 12/30/2021   Procedure: ENDOSCOPIC RETROGRADE CHOLANGIOPANCREATOGRAPHY (ERCP);  Surgeon: Midge Minium, MD;  Location: [facility];  Service: Endoscopy;  Laterality: N/A;   EXPLORATORY LAPAROTOMY     JOINT REPLACEMENT Bilateral  2009, 2010   knee   PARATHYROIDECTOMY Right 06/10/2011   growth/ Dr Willeen Cass   PARTIAL HYSTERECTOMY     Social History   Socioeconomic History   Marital status: Married    Spouse name: Not on file   Number of children: Not on file   Years of education: Not on file   Highest education level: 12th grade  Occupational History   Not on file  Tobacco Use   Smoking status: Former    Current packs/day: 0.00    Types: Cigarettes    Start date: 11/10/1963    Quit date: 11/09/1968    Years since quitting: 55.1   Smokeless tobacco: Never   Tobacco comments:    Quit smoking about 35 years ago.  Vaping Use   Vaping status: Never Used  Substance and Sexual Activity   Alcohol use: No   Drug use: No   Sexual activity: Yes    Birth control/protection: None    Comment: I'm 82. Don't need. Also had a partial hysterectomy many yea  Other Topics Concern   Not on file  Social History Narrative   Not on file   Social Drivers of Health   Financial Resource Strain: Medium Risk (12/02/2023)   Overall Financial Resource Strain (CARDIA)    Difficulty of Paying Living Expenses: Somewhat hard  Food Insecurity: No Food Insecurity (12/02/2023)   Hunger Vital Sign    Worried About Running Out of Food in the Last Year: Never true    Ran Out of Food in the Last Year: Never true  Transportation Needs: No Transportation Needs (12/02/2023)   PRAPARE - Administrator, Civil Service (Medical): No    Lack of Transportation (Non-Medical): No  Physical Activity: Unknown (12/02/2023)   Exercise Vital Sign    Days of Exercise per Week: 0 days    Minutes of Exercise per Session: Not on file  Stress: Stress Concern Present (12/02/2023)   [employer] of Occupational Health - Occupational Stress Questionnaire    Feeling of Stress : To some extent  Social Connections: Moderately Integrated (12/02/2023)   Social Connection and Isolation Panel [NHANES]    Frequency of Communication with Friends and  Family: More than three times a week    Frequency of Social Gatherings with Friends and Family: Once a week    Attends Religious Services: More than 4 times per year    Active Member of [employer] or Organizations: No    Attends Engineer, structural: Not on file    Marital Status: Married  Catering manager Violence: Not on file   Family Status  Relation Name Status   Mother Eliezer Mccoy Deceased   Father Gavin Pound Deceased   Sister Mel Almond (Not Specified)  No partnership data on file   Family History  Problem Relation Age of Onset   Arthritis Mother    Parkinson's disease Mother    Depression Mother    Diabetes Father    Hypertension Father    Stroke Father  Arthritis Father    Hearing loss Father    Heart disease Father    Breast cancer Sister 88       DCIS   Cancer Sister    Allergies  Allergen Reactions   Levofloxacin Shortness Of Breath    Chest pressure, head pressure   Ampicillin Itching   Celebrex [Celecoxib] Hives   Nexium [Esomeprazole Magnesium] Hives    Diarrhea, hives   Ranitidine Itching   Zyrtec [Cetirizine] Other (See Comments)    Feel ill/diarrhea    Patient Care Team: Birch Farino, Monico Blitz, DO as PCP - General (Family Medicine) Mariah Milling, Tollie Pizza, MD as PCP - Cardiology (Cardiology) Kieth Brightly, MD as Consulting Physician (General Surgery) Alan Mulder, MD as Attending Physician (Endocrinology)   Medications: Outpatient Medications Prior to Visit  Medication Sig   denosumab (PROLIA) 60 MG/ML SOSY injection Inject 60 mg into the skin every 6 (six) months. Most recent injection 03/15/2023   ELIQUIS 5 MG TABS tablet TAKE 1 TABLET(5 MG) BY MOUTH TWICE DAILY   lisinopril (ZESTRIL) 10 MG tablet TAKE 1 TABLET BY MOUTH DAILY   lovastatin (MEVACOR) 40 MG tablet Take 1 tablet (40 mg total) by mouth in the morning.   metoprolol succinate (TOPROL-XL) 25 MG 24 hr tablet TAKE 1/2 TABLET(12.5 MG) BY MOUTH DAILY   Vitamin D,  Ergocalciferol, (DRISDOL) 1.25 MG (50000 UNIT) CAPS capsule Take 50,000 Units by mouth every 7 (seven) days.   No facility-administered medications prior to visit.    Review of Systems  Constitutional:  Negative for chills, fatigue and fever.  HENT:  Positive for sinus pressure. Negative for congestion, ear pain, rhinorrhea, sinus pain, sneezing and sore throat.   Eyes: Negative.  Negative for pain and redness.  Respiratory:  Negative for cough, shortness of breath and wheezing.   Cardiovascular:  Negative for chest pain and leg swelling.  Gastrointestinal:  Negative for abdominal pain, blood in stool, constipation, diarrhea and nausea.  Endocrine: Negative for polydipsia and polyphagia.  Genitourinary: Negative.  Negative for dysuria, flank pain, hematuria, pelvic pain, vaginal bleeding and vaginal discharge.  Musculoskeletal:  Positive for arthralgias (right hip) and gait problem (d/t pain). Negative for back pain and joint swelling.  Skin:  Negative for rash.  Neurological:  Positive for headaches (at crown of head intemrittently). Negative for dizziness, tremors, seizures, weakness, light-headedness and numbness.  Hematological:  Negative for adenopathy.  Psychiatric/Behavioral: Negative.  Negative for behavioral problems, confusion and dysphoric mood. The patient is not nervous/anxious and is not hyperactive.     {Insert previous labs (optional):23779} {See past labs  Heme  Chem  Endocrine  Serology  Results Review (optional):1}  Objective    BP (!) 154/54   Pulse 66   Resp 18   Ht 5\' 1"  (1.549 m)   Wt 146 lb (66.2 kg)   SpO2 96%   BMI 27.59 kg/m  {Insert last BP/Wt (optional):23777}{See vitals history (optional):1}  Physical Exam Vitals and nursing note reviewed.  Constitutional:      General: She is awake.     Appearance: Normal appearance.  HENT:     Head: Normocephalic and atraumatic.     Right Ear: Tympanic membrane, ear canal and external ear normal.      Left Ear: Tympanic membrane, ear canal and external ear normal.     Nose: Nose normal.     Mouth/Throat:     Mouth: Mucous membranes are moist.     Pharynx: Oropharynx is clear. No oropharyngeal exudate  or posterior oropharyngeal erythema.  Eyes:     General: No scleral icterus.    Extraocular Movements: Extraocular movements intact.     Conjunctiva/sclera: Conjunctivae normal.     Pupils: Pupils are equal, round, and reactive to light.  Neck:     Thyroid: No thyromegaly or thyroid tenderness.  Cardiovascular:     Rate and Rhythm: Normal rate and regular rhythm.     Pulses: Normal pulses.          Dorsalis pedis pulses are 2+ on the right side and 2+ on the left side.       Posterior tibial pulses are 2+ on the right side and 2+ on the left side.     Heart sounds: Normal heart sounds.  Pulmonary:     Effort: Pulmonary effort is normal. No tachypnea, bradypnea or respiratory distress.     Breath sounds: Normal breath sounds. No stridor. No wheezing, rhonchi or rales.  Abdominal:     General: Bowel sounds are normal. There is no distension.     Palpations: Abdomen is soft. There is no mass.     Tenderness: There is no abdominal tenderness. There is no guarding.     Hernia: No hernia is present.  Musculoskeletal:     Cervical back: Normal range of motion and neck supple.     Right hip: Tenderness present. Decreased range of motion.     Right lower leg: No edema.     Left lower leg: No edema.     Right foot: Normal range of motion. No deformity, bunion, Charcot foot, foot drop or prominent metatarsal heads.     Left foot: Normal range of motion. No deformity, bunion, Charcot foot, foot drop or prominent metatarsal heads.  Feet:     Right foot:     Protective Sensation: 10 sites tested.  10 sites sensed.     Skin integrity: No ulcer, blister, skin breakdown, erythema, warmth, callus, dry skin or fissure.     Toenail Condition: Right toenails are normal.     Left foot:     Protective  Sensation: 10 sites tested.  10 sites sensed.     Skin integrity: No ulcer, blister, skin breakdown, erythema, warmth, callus, dry skin or fissure.     Toenail Condition: Left toenails are normal.  Lymphadenopathy:     Cervical: No cervical adenopathy.  Skin:    General: Skin is warm and dry.  Neurological:     Mental Status: She is alert and oriented to person, place, and time. Mental status is at baseline.  Psychiatric:        Mood and Affect: Mood normal.        Behavior: Behavior normal.       Last depression screening scores    12/06/2023    9:15 AM 08/09/2023    1:34 PM  PHQ 2/9 Scores  PHQ - 2 Score 5 0  PHQ- 9 Score 11 7   Last fall risk screening    08/09/2023    1:34 PM  Fall Risk   Falls in the past year? 0  Number falls in past yr: 0  Injury with Fall? 0   Last Audit-C alcohol use screening    12/02/2023    2:58 PM  Alcohol Use Disorder Test (AUDIT)  1. How often do you have a drink containing alcohol? 0  3. How often do you have six or more drinks on one occasion? 0   A score of 3 or  more in women, and 4 or more in men indicates increased risk for alcohol abuse, EXCEPT if all of the points are from question 1   No results found for any visits on 12/06/23.  Assessment & Plan    Routine Health Maintenance and Physical Exam  Exercise Activities and Dietary recommendations  Goals   None     Immunization History  Administered Date(s) Administered   Fluad Trivalent(High Dose 65+) 08/09/2023   Moderna Covid-19 Vaccine Bivalent Booster 69yrs & up 02/13/2021, 10/17/2021   PFIZER(Purple Top)SARS-COV-2 Vaccination 12/01/2019, 12/19/2019, 08/24/2020   Pneumococcal Polysaccharide-23 07/10/2014   Td 07/20/2016   Zoster Recombinant(Shingrix) 05/16/2018    Health Maintenance  Topic Date Due   FOOT EXAM  Never done   OPHTHALMOLOGY EXAM  Never done   Diabetic kidney evaluation - Urine ACR  Never done   Pneumonia Vaccine 66+ Years old (2 of 2 - PCV)  07/11/2015   Zoster Vaccines- Shingrix (2 of 2) 07/11/2018   COVID-19 Vaccine (6 - 2024-25 season) 07/11/2023   Medicare Annual Wellness (AWV)  10/28/2023   HEMOGLOBIN A1C  12/01/2023   Diabetic kidney evaluation - eGFR measurement  05/30/2024   DTaP/Tdap/Td (2 - Tdap) 07/20/2026   INFLUENZA VACCINE  Completed   DEXA SCAN  Completed   HPV VACCINES  Aged Out    Discussed health benefits of physical activity, and encouraged her to engage in regular exercise appropriate for her age and condition.   There are no diagnoses linked to this encounter.  Hip Pain   Hypertension Elevated blood pressure with systolic BP around 179-180 mmHg. Patient on lisinopril, previously adjusted from 5 mg to 10 mg. Discussed increasing lisinopril dosage to better manage blood pressure. Patient agreed to increase dosage and monitor for dizziness, lightheadedness, and vision changes. - Increase lisinopril dosage - Monitor for dizziness, lightheadedness, and vision changes - Follow up in four weeks to reassess blood pressure  Hyperlipidemia Patient on lovastatin with good LDL levels. Discussed heart attack and stroke risk based on cholesterol levels and other factors. - Continue current lovastatin dosage - Order blood work to check cholesterol levels  Osteoporosis Patient received Prolia injection last week. Last DEXA scan in December 2023. - Continue Prolia injections as scheduled  Vaccination Status Up to date on flu vaccine but missed the second shingles vaccine. Only received one pneumonia vaccine (Pneumovax 23). Discussed need for second shingles vaccine and benefits of Prevnar 20 for pneumonia. - Recommend second shingles vaccine - Recommend Prevnar 20 for pneumonia  General Health Maintenance No mammogram in a couple of years. No recent eye exam records. Discussed importance of regular mammograms and eye exams, especially given family history of breast cancer. - Order mammogram - Request eye  exam records from Dr. Clydene Pugh - Schedule annual wellness visit for August 2025  Follow-up - Follow up in four weeks for blood pressure reassessment - Schedule annual wellness visit for August 2025.  ***  No follow-ups on file.     I discussed the assessment and treatment plan with the patient  The patient was provided an opportunity to ask questions and all were answered. The patient agreed with the plan and demonstrated an understanding of the instructions.   The patient was advised to call back or seek an in-person evaluation if the symptoms worsen or if the condition fails to improve as anticipated.    Sherlyn Hay, DO  [facility] [phone] (phone) [phone] (fax)  [facility]

## 2023-12-06 NOTE — Assessment & Plan Note (Signed)
Chronic hip pain for three months, initially thought to be sciatica. Pain localized to the hip and buttock, exacerbated by walking and certain movements. Differential includes hip pathology versus sciatica. No prior hip-specific imaging. Discussed steroid taper benefits and side effects, including elevated blood sugar and confusion. Patient prefers to avoid muscle relaxers. - Order hip x-ray - Refer to orthopedics - Prescribe 7-day steroid taper

## 2023-12-06 NOTE — Assessment & Plan Note (Signed)
Physical exam overall unremarkable except as noted above. Routine lab work ordered as noted. Due for mammogram. No recent eye exam records. Discussed importance of regular mammograms and eye exams, especially given family history of breast cancer. - Order mammogram - Request eye exam records from Dr. Clydene Pugh - Schedule annual wellness visit for August 2025  Up-to-date on flu vaccine but missed the second shingles vaccine. Only received one pneumonia vaccine (Pneumovax 23). Discussed need for second shingles vaccine and benefits of Prevnar 20 for pneumonia. - Recommend second shingles vaccine - Recommend Prevnar 20 for pneumonia

## 2023-12-07 ENCOUNTER — Encounter: Payer: Self-pay | Admitting: Family Medicine

## 2023-12-07 NOTE — Assessment & Plan Note (Signed)
Elevated blood pressure with systolic BP around 179-180 mmHg. Patient on lisinopril, previously adjusted from 5 mg to 10 mg. Discussed increasing lisinopril dosage to better manage blood pressure. Patient agreed to increase dosage and monitor for dizziness, lightheadedness, and vision changes. - Increase lisinopril dosage to 20 mg daily - Monitor for dizziness, lightheadedness, and vision changes - Follow up in four weeks to reassess blood pressure

## 2023-12-07 NOTE — Assessment & Plan Note (Signed)
Patient received Prolia injection last week. Last DEXA scan in December 2023. - Continue Prolia injections as scheduled

## 2023-12-07 NOTE — Assessment & Plan Note (Signed)
Noted.  Managed on eliquis 5 mg BID.  No acute concerns today.  Follows with cardiology; defer to specialist management. Continue to monitor.

## 2023-12-07 NOTE — Assessment & Plan Note (Signed)
No acute concerns.  Continue to monitor. Will check A1c today.

## 2023-12-07 NOTE — Assessment & Plan Note (Signed)
Patient on lovastatin with good LDL levels. Discussed heart attack and stroke risk based on cholesterol levels and other factors. - Continue current lovastatin dosage - Order blood work to check cholesterol levels

## 2023-12-07 NOTE — Assessment & Plan Note (Signed)
History of vitamin D deficiency on high-dose supplementation. Will recheck level today.

## 2023-12-08 DIAGNOSIS — M1611 Unilateral primary osteoarthritis, right hip: Secondary | ICD-10-CM | POA: Diagnosis not present

## 2023-12-08 DIAGNOSIS — M47816 Spondylosis without myelopathy or radiculopathy, lumbar region: Secondary | ICD-10-CM | POA: Diagnosis not present

## 2023-12-08 DIAGNOSIS — M51369 Other intervertebral disc degeneration, lumbar region without mention of lumbar back pain or lower extremity pain: Secondary | ICD-10-CM | POA: Diagnosis not present

## 2023-12-08 LAB — COMPREHENSIVE METABOLIC PANEL
ALT: 15 [IU]/L (ref 0–32)
AST: 15 [IU]/L (ref 0–40)
Albumin: 4.5 g/dL (ref 3.7–4.7)
Alkaline Phosphatase: 68 [IU]/L (ref 44–121)
BUN/Creatinine Ratio: 16 (ref 12–28)
BUN: 13 mg/dL (ref 8–27)
Bilirubin Total: 0.4 mg/dL (ref 0.0–1.2)
CO2: 19 mmol/L — ABNORMAL LOW (ref 20–29)
Calcium: 9 mg/dL (ref 8.7–10.3)
Chloride: 105 mmol/L (ref 96–106)
Creatinine, Ser: 0.82 mg/dL (ref 0.57–1.00)
Globulin, Total: 2.2 g/dL (ref 1.5–4.5)
Glucose: 98 mg/dL (ref 70–99)
Potassium: 4.8 mmol/L (ref 3.5–5.2)
Sodium: 142 mmol/L (ref 134–144)
Total Protein: 6.7 g/dL (ref 6.0–8.5)
eGFR: 72 mL/min/{1.73_m2} (ref >59)

## 2023-12-08 LAB — LIPID PANEL
Chol/HDL Ratio: 2.8 {ratio} (ref 0.0–4.4)
Cholesterol, Total: 187 mg/dL (ref 100–199)
HDL: 67 mg/dL (ref >39)
LDL Chol Calc (NIH): 92 mg/dL (ref 0–99)
Triglycerides: 165 mg/dL — ABNORMAL HIGH (ref 0–149)
VLDL Cholesterol Cal: 28 mg/dL (ref 5–40)

## 2023-12-08 LAB — MICROALBUMIN / CREATININE URINE RATIO
Creatinine, Urine: 338.5 mg/dL
Microalb/Creat Ratio: 26 mg/g{creat} (ref 0–29)
Microalbumin, Urine: 87.5 ug/mL

## 2023-12-08 LAB — HEMOGLOBIN A1C
Est. average glucose Bld gHb Est-mCnc: 105 mg/dL
Hgb A1c MFr Bld: 5.3 % (ref 4.8–5.6)

## 2023-12-08 LAB — VITAMIN D 25 HYDROXY (VIT D DEFICIENCY, FRACTURES): Vit D, 25-Hydroxy: 71.1 ng/mL (ref 30.0–100.0)

## 2023-12-12 ENCOUNTER — Other Ambulatory Visit: Payer: Self-pay | Admitting: Physician Assistant

## 2023-12-17 ENCOUNTER — Encounter: Payer: Self-pay | Admitting: Family Medicine

## 2023-12-27 ENCOUNTER — Other Ambulatory Visit: Payer: Self-pay | Admitting: Family Medicine

## 2023-12-28 NOTE — Telephone Encounter (Signed)
 Requested medication (s) are due for refill today - expired Rx  Requested medication (s) are on the active medication list -yes  Future visit scheduled -yes  Last refill: 04/27/22  Notes to clinic: high dose medication - provider review- expired RX, historical medication   Requested Prescriptions  Pending Prescriptions Disp Refills   Vitamin D, Ergocalciferol, (DRISDOL) 1.25 MG (50000 UNIT) CAPS capsule [Pharmacy Med Name: VITAMIN D2 50,000IU (ERGO) CAP RX] 12 capsule     Sig: TAKE 1 CAPSULE BY MOUTH WEEKLY     Endocrinology:  Vitamins - Vitamin D Supplementation 2 Failed - 12/28/2023 12:32 PM      Failed - Manual Review: Route requests for 50,000 IU strength to the provider      Passed - Ca in normal range and within 360 days    Calcium  Date Value Ref Range Status  12/06/2023 9.0 8.7 - 10.3 mg/dL Final         Passed - Vitamin D in normal range and within 360 days    Vit D, 25-Hydroxy  Date Value Ref Range Status  12/06/2023 71.1 30.0 - 100.0 ng/mL Final    Comment:    Vitamin D deficiency has been defined by the Institute of Medicine and an Endocrine Society practice guideline as a level of serum 25-OH vitamin D less than 20 ng/mL (1,2). The Endocrine Society went on to further define vitamin D insufficiency as a level between 21 and 29 ng/mL (2). 1. IOM (Institute of Medicine). 2010. Dietary reference    intakes for calcium and D. [place]: The    [employer]. 2. Holick MF, Binkley Grenola, Bischoff-Ferrari HA, et al.    Evaluation, treatment, and prevention of vitamin D    deficiency: an Endocrine Society clinical practice    guideline. JCEM. 2011 Jul; 96(7):1911-30.          Passed - Valid encounter within last 12 months    Recent Outpatient Visits           3 weeks ago Annual physical exam   [facility] [place], Monico Blitz, DO   4 months ago Paroxysmal atrial fibrillation [facility])   [facility]  Pardue, Monico Blitz, DO       Future Appointments             In 6 days Pardue, Monico Blitz, DO [facility], [facility]   In 5 months Pardue, Monico Blitz, DO [place] [facility], [facility]               Requested Prescriptions  Pending Prescriptions Disp Refills   Vitamin D, Ergocalciferol, (DRISDOL) 1.25 MG (50000 UNIT) CAPS capsule [Pharmacy Med Name: VITAMIN D2 50,000IU (ERGO) CAP RX] 12 capsule     Sig: TAKE 1 CAPSULE BY MOUTH WEEKLY     Endocrinology:  Vitamins - Vitamin D Supplementation 2 Failed - 12/28/2023 12:32 PM      Failed - Manual Review: Route requests for 50,000 IU strength to the provider      Passed - Ca in normal range and within 360 days    Calcium  Date Value Ref Range Status  12/06/2023 9.0 8.7 - 10.3 mg/dL Final         Passed - Vitamin D in normal range and within 360 days    Vit D, 25-Hydroxy  Date Value Ref Range Status  12/06/2023 71.1 30.0 - 100.0 ng/mL Final    Comment:  Vitamin D deficiency has been defined by the Institute of Medicine and an Endocrine Society practice guideline as a level of serum 25-OH vitamin D less than 20 ng/mL (1,2). The Endocrine Society went on to further define vitamin D insufficiency as a level between 21 and 29 ng/mL (2). 1. IOM (Institute of Medicine). 2010. Dietary reference    intakes for calcium and D. [place]: The    [employer]. 2. Holick MF, Binkley [place], Bischoff-Ferrari HA, et al.    Evaluation, treatment, and prevention of vitamin D    deficiency: an Endocrine Society clinical practice    guideline. JCEM. 2011 Jul; 96(7):1911-30.          Passed - Valid encounter within last 12 months    Recent Outpatient Visits           3 weeks ago Annual physical exam   [facility], Monico Blitz, DO   4 months ago Paroxysmal atrial fibrillation [facility])   [facility] Pardue, Monico Blitz, DO       Future  Appointments             In 6 days Pardue, Monico Blitz, DO [facility], [facility]   In 5 months Pardue, Monico Blitz, DO [place] [facility], [facility]

## 2024-01-03 ENCOUNTER — Ambulatory Visit (INDEPENDENT_AMBULATORY_CARE_PROVIDER_SITE_OTHER): Payer: Medicare Other | Admitting: Family Medicine

## 2024-01-03 ENCOUNTER — Encounter: Payer: Self-pay | Admitting: Family Medicine

## 2024-01-03 VITALS — BP 150/58 | HR 60 | Resp 18 | Ht 61.0 in | Wt 151.0 lb

## 2024-01-03 DIAGNOSIS — Z7189 Other specified counseling: Secondary | ICD-10-CM | POA: Insufficient documentation

## 2024-01-03 DIAGNOSIS — E559 Vitamin D deficiency, unspecified: Secondary | ICD-10-CM | POA: Diagnosis not present

## 2024-01-03 DIAGNOSIS — G8929 Other chronic pain: Secondary | ICD-10-CM

## 2024-01-03 DIAGNOSIS — M25551 Pain in right hip: Secondary | ICD-10-CM

## 2024-01-03 DIAGNOSIS — Z01818 Encounter for other preprocedural examination: Secondary | ICD-10-CM | POA: Diagnosis not present

## 2024-01-03 DIAGNOSIS — J309 Allergic rhinitis, unspecified: Secondary | ICD-10-CM | POA: Insufficient documentation

## 2024-01-03 DIAGNOSIS — I10 Essential (primary) hypertension: Secondary | ICD-10-CM | POA: Diagnosis not present

## 2024-01-03 DIAGNOSIS — R1310 Dysphagia, unspecified: Secondary | ICD-10-CM | POA: Insufficient documentation

## 2024-01-03 MED ORDER — VITAMIN D (ERGOCALCIFEROL) 1.25 MG (50000 UNIT) PO CAPS: 50000.0000 [IU] | ORAL_CAPSULE | ORAL | 3 refills | Status: DC

## 2024-01-03 NOTE — Assessment & Plan Note (Signed)
 Patient is considering a durable medical power of attorney instead of a DNR due to concerns about resuscitation during surgery. Prefers not to be on life-support machines. Discussed importance of having written advanced directives and medical power of attorney in place. - Recommend establishing a durable medical power of attorney

## 2024-01-03 NOTE — Assessment & Plan Note (Signed)
 Patient has been on vitamin D supplementation for several years. Recent blood test showed a good level of 71. Previously required high-dose supplementation twice weekly before being switched back down to once weekly.  Continue current regimen. - Continue current vitamin D supplementation regimen - Consider checking vitamin K level at future visit.

## 2024-01-03 NOTE — Assessment & Plan Note (Signed)
 Chronic nasal congestion, dry throat, and drainage, likely due to allergies. Patient uses sugar-free hard candy for symptom relief and avoids medications due to heart condition. Discussed non-pharmacological options for symptom relief. - Recommend using a cool mist humidifier

## 2024-01-03 NOTE — Assessment & Plan Note (Signed)
 Intermittent difficulty swallowing, with food getting stuck on the side of the throat. Symptoms have been present for about six months. Discussed potential need for gastroenterology referral if symptoms persist. - Consider referral to gastroenterology if symptoms persist

## 2024-01-03 NOTE — Patient Instructions (Signed)
 Recommend doing a durable medical power of attorney.

## 2024-01-03 NOTE — Assessment & Plan Note (Signed)
 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 Patient prefers hip surgery over injections for long-term relief. Discussed surgical risks, including potential heart complications, but patient reassured by previous successful surgery. - Proceed with hip surgery as planned - Ensure preoperative clearance from cardiology - Stop Eliquis before surgery - Follow up with Dr. Magnus Ivan on March 5

## 2024-01-03 NOTE — Progress Notes (Signed)
 Established patient visit   Patient: Charlene Norris   DOB: [birth year]   83 y.o. Female  MRN: [id number] Visit Date: 01/03/2024  Today's healthcare provider: Sherlyn Hay, DO   Chief Complaint  Patient presents with   Hypertension   Subjective    HPI Charlene Norris is an 83 year old female who presents for follow-up and discussion of hip surgery. She is accompanied by her husband.  She has been experiencing right hip pain for several months, significantly impacting her daily activities and contributing to her depression and sleep disturbances. An x-ray was performed, and she has an appointment scheduled with Doctor Magnus Ivan (orthopedics) on March 5th for further evaluation.  She has a history of heart issues, including a past incident where her heart stopped during surgery. She has not had a recent appointment with her cardiologist. She normally sees [employer] but is unsure if he is still practicing. No current heart-related symptoms are reported.  She experiences a stuffy nose, dry throat, and drainage, which she attributes to allergies. She manages her symptoms with sugar-free hard candy and occasionally uses a natural drop for relief. No ear pain or sore throat is noted.  She notes difficulty swallowing, with food sometimes getting stuck on the side of her throat for the past six months. She has not seen a gastroenterologist recently.  She has a history of vitamin D deficiency and has been on supplementation for several years, currently taking it once a week. Her last vitamin D level was 71.  She mentions a positive depression screening in the past and attributes her current depressive symptoms to her hip pain. She feels unusually depressed and feels better after a good night's sleep, and notes that her pain threshold is low.  She sees Dr. Clydene Pugh for an eye exam once per year and will request the record be sent here.    Medications: Outpatient Medications Prior to  Visit  Medication Sig   denosumab (PROLIA) 60 MG/ML SOSY injection Inject 60 mg into the skin every 6 (six) months. Most recent injection 03/15/2023   ELIQUIS 5 MG TABS tablet TAKE 1 TABLET(5 MG) BY MOUTH TWICE DAILY   lisinopril (ZESTRIL) 20 MG tablet Take 1 tablet (20 mg total) by mouth daily.   lovastatin (MEVACOR) 40 MG tablet Take 1 tablet (40 mg total) by mouth in the morning.   metoprolol succinate (TOPROL-XL) 25 MG 24 hr tablet TAKE 1/2 TABLET(12.5 MG) BY MOUTH DAILY   [DISCONTINUED] predniSONE (DELTASONE) 20 MG tablet Take 60mg  PO daily x 2 days, then40mg  PO daily x 2 days, then 20mg  PO daily x 3 days   [DISCONTINUED] Vitamin D, Ergocalciferol, (DRISDOL) 1.25 MG (50000 UNIT) CAPS capsule Take 50,000 Units by mouth every 7 (seven) days.   No facility-administered medications prior to visit.    Review of Systems  Constitutional:  Negative for appetite change, chills, fatigue and fever.  HENT:  Positive for congestion, postnasal drip and trouble swallowing. Negative for ear pain, sinus pressure, sinus pain and sore throat.        +dry throat  Respiratory:  Negative for chest tightness and shortness of breath.   Cardiovascular:  Negative for chest pain and palpitations.  Gastrointestinal:  Negative for abdominal pain, nausea and vomiting.  Musculoskeletal:  Positive for arthralgias (right hip).  Neurological:  Negative for dizziness and weakness.        Objective    BP (!) 150/58   Pulse 60  Resp 18   Ht 5\' 1"  (1.549 m)   Wt 151 lb (68.5 kg)   SpO2 100%   BMI 28.53 kg/m     Physical Exam Constitutional:      Appearance: Normal appearance.  HENT:     Head: Normocephalic and atraumatic.  Eyes:     General: No scleral icterus.    Extraocular Movements: Extraocular movements intact.     Conjunctiva/sclera: Conjunctivae normal.  Cardiovascular:     Rate and Rhythm: Normal rate and regular rhythm.     Pulses: Normal pulses.     Heart sounds: Normal heart sounds.   Pulmonary:     Effort: Pulmonary effort is normal. No respiratory distress.     Breath sounds: Normal breath sounds.  Abdominal:     General: Bowel sounds are normal. There is no distension.     Palpations: Abdomen is soft. There is no mass.     Tenderness: There is no abdominal tenderness. There is no guarding.  Musculoskeletal:     Right lower leg: No edema.     Left lower leg: No edema.  Skin:    General: Skin is warm and dry.  Neurological:     Mental Status: She is alert and oriented to person, place, and time. Mental status is at baseline.  Psychiatric:        Mood and Affect: Mood normal.        Behavior: Behavior normal.      No results found for any visits on 01/03/24.  Assessment & Plan    Primary hypertension Assessment & Plan: Blood pressure has improved, likely due to better sleep and pain management. Patient is monitoring salt intake and believes pain is contributing to elevated blood pressure. Discussed monitoring blood pressure post-surgery to ensure pain management is effective. - Monitor blood pressure - Follow up in 3-4 months post-surgery to reassess blood pressure   Pre-op examination Assessment & Plan: Revised-CRI: 0 points, indicating 3.9% risk of major cardiac event. NS-QIP indicates lower than average risk. Patient does have history of her heart stopping during a previous surgery but has had surgery (cholecystectomy) since without problem. Based on these factors and her medical history, patient is average risk for the planned surgery.   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 Patient prefers hip surgery over injections for long-term relief. Discussed surgical risks, including potential heart complications, but patient reassured by previous successful surgery. - Proceed with hip surgery as planned - Ensure preoperative clearance from cardiology - Stop Eliquis before surgery - Follow up with  Dr. Magnus Ivan on March 5   Vitamin D deficiency Assessment & Plan: Patient has been on vitamin D supplementation for several years. Recent blood test showed a good level of 71. Previously required high-dose supplementation twice weekly before being switched back down to once weekly.  Continue current regimen. - Continue current vitamin D supplementation regimen - Consider checking vitamin K level at future visit.  Orders: -     Vitamin D (Ergocalciferol); Take 1 capsule (50,000 Units total) by mouth every 7 (seven) days.  Dispense: 12 capsule; Refill: 3  Dysphagia, unspecified type Assessment & Plan: Intermittent difficulty swallowing, with food getting stuck on the side of the throat. Symptoms have been present for about six months. Discussed potential need for gastroenterology referral if symptoms persist. - Consider referral to gastroenterology if symptoms persist   Allergic rhinitis, unspecified seasonality, unspecified trigger Assessment & Plan: Chronic nasal congestion,  dry throat, and drainage, likely due to allergies. Patient uses sugar-free hard candy for symptom relief and avoids medications due to heart condition. Discussed non-pharmacological options for symptom relief. - Recommend using a cool mist humidifier   Goals of care, counseling/discussion Assessment & Plan: Patient is considering a durable medical power of attorney instead of a DNR due to concerns about resuscitation during surgery. Prefers not to be on life-support machines. Discussed importance of having written advanced directives and medical power of attorney in place. - Recommend establishing a durable medical power of attorney    General Health Maintenance Patient has regular eye exams with Dr. Clydene Pugh and plans to update records. - Request latest eye exam results from Dr. Clydene Pugh  Follow-up - Follow up with Dr. Magnus Ivan on March 5 - Schedule follow-up in 3-4 months post-surgery to reassess blood  pressure.   Return in about 3 months (around 04/01/2024) for HTN.      I discussed the assessment and treatment plan with the patient  The patient was provided an opportunity to ask questions and all were answered. The patient agreed with the plan and demonstrated an understanding of the instructions.   The patient was advised to call back or seek an in-person evaluation if the symptoms worsen or if the condition fails to improve as anticipated.    Sherlyn Hay, DO  [facility] [phone] (phone) [phone] (fax)  [facility]

## 2024-01-03 NOTE — Assessment & Plan Note (Signed)
 Blood pressure has improved, likely due to better sleep and pain management. Patient is monitoring salt intake and believes pain is contributing to elevated blood pressure. Discussed monitoring blood pressure post-surgery to ensure pain management is effective. - Monitor blood pressure - Follow up in 3-4 months post-surgery to reassess blood pressure

## 2024-01-03 NOTE — Assessment & Plan Note (Addendum)
 Revised-CRI: 0 points, indicating 3.9% risk of major cardiac event. NS-QIP indicates lower than average risk. Patient does have history of her heart stopping during a previous surgery but has had surgery (cholecystectomy) since without problem. Based on these factors and her medical history, patient is average risk for the planned surgery.

## 2024-01-12 ENCOUNTER — Ambulatory Visit: Payer: Medicare HMO | Admitting: Orthopaedic Surgery

## 2024-01-12 DIAGNOSIS — M1611 Unilateral primary osteoarthritis, right hip: Secondary | ICD-10-CM | POA: Insufficient documentation

## 2024-01-12 NOTE — Progress Notes (Signed)
 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  She is from [place] and she did see an orthopedic surgeon in [place] who wanted to send her to [facility] for surgery.  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  She does ambulate using a cane.  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  She is a diabetic but under excellent control.  She does take Eliquis for chronic A-fib.  She sees her cardiologist on a regular basis.  She has had no acute change in her medical status.  I was able to review medications and past medical history within epic on her.  She does report daily right hip pain that is severe.  It is detrimentally affecting her mobility, her quality of life and her actives daily living.  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  This is bone-on-bone wear with complete loss of the space.  There are sclerotic changes and cystic changes as well as osteophytes.  Examination of right hip shows essentially significant limitations in range of motion of the right hip with severe pain in the groin with attempts of range of motion.  Her left hip no smoothly and fluidly.  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  We had a long and thorough discussion about a direct anterior total hip arthroplasty surgery.  She does wish to proceed with surgery given the severity of her right hip pain and the severity of her arthritis.  We discussed what to expect from an intraoperative and postoperative standpoint.  We discussed the risk and benefits of the surgery as well.  She would need to stop Eliquis 3 full days before surgery in order to have spinal anesthesia and she understands this as well.  All questions and concerns were answered and  addressed.  Will work on getting her scheduled for right total hip arthroplasty in the near future.

## 2024-02-01 ENCOUNTER — Encounter: Payer: Self-pay | Admitting: Family Medicine

## 2024-02-03 NOTE — Telephone Encounter (Signed)
 Pt just wanted to let her PcP know the information below about her surgery

## 2024-02-07 ENCOUNTER — Encounter: Payer: Self-pay | Admitting: Family Medicine

## 2024-02-07 DIAGNOSIS — I10 Essential (primary) hypertension: Secondary | ICD-10-CM

## 2024-02-08 ENCOUNTER — Other Ambulatory Visit: Payer: Self-pay | Admitting: Family Medicine

## 2024-02-09 ENCOUNTER — Encounter: Payer: Self-pay | Admitting: Family Medicine

## 2024-02-10 ENCOUNTER — Telehealth: Payer: Self-pay | Admitting: *Deleted

## 2024-02-10 NOTE — Patient Instructions (Signed)
 SURGICAL WAITING ROOM VISITATION  Patients having surgery or a procedure may have no more than 2 support people in the waiting area - these visitors may rotate.    Children under the age of 13 must have an adult with them who is not the patient.  Due to an increase in RSV and influenza rates and associated hospitalizations, children ages 30 and under may not visit patients in [facility] hospitals.  Visitors with respiratory illnesses are discouraged from visiting and should remain at home.  If the patient needs to stay at the hospital during part of their recovery, the visitor guidelines for inpatient rooms apply. Pre-op nurse will coordinate an appropriate time for 1 support person to accompany patient in pre-op.  This support person may not rotate.    Please refer to the [facility] website for the visitor guidelines for Inpatients (after your surgery is over and you are in a regular room).    Your procedure is scheduled on: 02/18/24   Report to [facility] Main Entrance    Report to admitting at 6:00 AM   Call this number if you have problems the morning of surgery [phone]   Do not eat food :After Midnight.   After Midnight you may have the following liquids until 5:30 AM DAY OF SURGERY  Water Non-Citrus Juices (without pulp, NO RED-Apple, White grape, White cranberry) Black Coffee (NO MILK/CREAM OR CREAMERS, sugar ok)  Clear Tea (NO MILK/CREAM OR CREAMERS, sugar ok) regular and decaf                             Plain Jell-O (NO RED)                                           Fruit ices (not with fruit pulp, NO RED)                                     Popsicles (NO RED)                                                               Sports drinks like Gatorade (NO RED)                 The day of surgery:  Drink ONE (1) Pre-Surgery G2 at 5:30 AM the morning of surgery. Drink in one sitting. Do not sip.  This drink was given to you during your hospital  pre-op  appointment visit. Nothing else to drink after completing the  Pre-Surgery G2.          If you have questions, please contact your surgeon's office.   FOLLOW BOWEL PREP AND ANY ADDITIONAL PRE OP INSTRUCTIONS YOU RECEIVED FROM YOUR SURGEON'S OFFICE!!!     Oral Hygiene is also important to reduce your risk of infection.                                    Remember - BRUSH YOUR  TEETH THE MORNING OF SURGERY WITH YOUR REGULAR TOOTHPASTE  DENTURES WILL BE REMOVED PRIOR TO SURGERY PLEASE DO NOT APPLY "Poly grip" OR ADHESIVES!!!   Stop all vitamins and herbal supplements 7 days before surgery.   Take these medicines the morning of surgery with A SIP OF WATER: Tylenol, Metoprolol, Lovastatin                               You may not have any metal on your body including hair pins, jewelry, and body piercing             Do not wear make-up, lotions, powders, perfumes, or deodorant  Do not wear nail polish including gel and S&S, artificial/acrylic nails, or any other type of covering on natural nails including finger and toenails. If you have artificial nails, gel coating, etc. that needs to be removed by a nail salon please have this removed prior to surgery or surgery may need to be canceled/ delayed if the surgeon/ anesthesia feels like they are unable to be safely monitored.   Do not shave  48 hours prior to surgery.    Do not bring valuables to the hospital. [facility] IS NOT             RESPONSIBLE   FOR VALUABLES.   Contacts, glasses, dentures or bridgework may not be worn into surgery.   Bring small overnight bag day of surgery.   DO NOT BRING YOUR HOME MEDICATIONS TO THE HOSPITAL. PHARMACY WILL DISPENSE MEDICATIONS LISTED ON YOUR MEDICATION LIST TO YOU DURING YOUR ADMISSION IN THE HOSPITAL!   Special Instructions: Bring a copy of your healthcare power of attorney and living will documents the day of surgery if you haven't scanned them before.              Please read over the  following fact sheets you were given: IF YOU HAVE QUESTIONS ABOUT YOUR PRE-OP INSTRUCTIONS PLEASE CALL [name]    If you received a COVID test during your pre-op visit  it is requested that you wear a mask when out in public, stay away from anyone that may not be feeling well and notify your surgeon if you develop symptoms. If you test positive for Covid or have been in contact with anyone that has tested positive in the last 10 days please notify you surgeon.      Pre-operative 5 CHG Bath Instructions   You can play a key role in reducing the risk of infection after surgery. Your skin needs to be as free of germs as possible. You can reduce the number of germs on your skin by washing with CHG (chlorhexidine gluconate) soap before surgery. CHG is an antiseptic soap that kills germs and continues to kill germs even after washing.   DO NOT use if you have an allergy to chlorhexidine/CHG or antibacterial soaps. If your skin becomes reddened or irritated, stop using the CHG and notify one of our RNs at [phone].   Please shower with the CHG soap starting 4 days before surgery using the following schedule:     Please keep in mind the following:  DO NOT shave, including legs and underarms, starting the day of your first shower.   You may shave your face at any point before/day of surgery.  Place clean sheets on your bed the day you start using CHG soap. Use a clean washcloth (not used since being washed)  for each shower. DO NOT sleep with pets once you start using the CHG.   CHG Shower Instructions:  If you choose to wash your hair and private area, wash first with your normal shampoo/soap.  After you use shampoo/soap, rinse your hair and body thoroughly to remove shampoo/soap residue.  Turn the water OFF and apply about 3 tablespoons (45 ml) of CHG soap to a CLEAN washcloth.  Apply CHG soap ONLY FROM YOUR NECK DOWN TO YOUR TOES (washing for 3-5 minutes)  DO NOT use CHG soap  on face, private areas, open wounds, or sores.  Pay special attention to the area where your surgery is being performed.  If you are having back surgery, having someone wash your back for you may be helpful. Wait 2 minutes after CHG soap is applied, then you may rinse off the CHG soap.  Pat dry with a clean towel  Put on clean clothes/pajamas   If you choose to wear lotion, please use ONLY the CHG-compatible lotions on the back of this paper.     Additional instructions for the day of surgery: DO NOT APPLY any lotions, deodorants, cologne, or perfumes.   Put on clean/comfortable clothes.  Brush your teeth.  Ask your nurse before applying any prescription medications to the skin.      CHG Compatible Lotions   Aveeno Moisturizing lotion  Cetaphil Moisturizing Cream  Cetaphil Moisturizing Lotion  Clairol Herbal Essence Moisturizing Lotion, Dry Skin  Clairol Herbal Essence Moisturizing Lotion, Extra Dry Skin  Clairol Herbal Essence Moisturizing Lotion, Normal Skin  Curel Age Defying Therapeutic Moisturizing Lotion with Alpha Hydroxy  Curel Extreme Care Body Lotion  Curel Soothing Hands Moisturizing Hand Lotion  Curel Therapeutic Moisturizing Cream, Fragrance-Free  Curel Therapeutic Moisturizing Lotion, Fragrance-Free  Curel Therapeutic Moisturizing Lotion, Original Formula  Eucerin Daily Replenishing Lotion  Eucerin Dry Skin Therapy Plus Alpha Hydroxy Crme  Eucerin Dry Skin Therapy Plus Alpha Hydroxy Lotion  Eucerin Original Crme  Eucerin Original Lotion  Eucerin Plus Crme Eucerin Plus Lotion  Eucerin TriLipid Replenishing Lotion  Keri Anti-Bacterial Hand Lotion  Keri Deep Conditioning Original Lotion Dry Skin Formula Softly Scented  Keri Deep Conditioning Original Lotion, Fragrance Free Sensitive Skin Formula  Keri Lotion Fast Absorbing Fragrance Free Sensitive Skin Formula  Keri Lotion Fast Absorbing Softly Scented Dry Skin Formula  Keri Original Lotion  Keri Skin  Renewal Lotion Keri Silky Smooth Lotion  Keri Silky Smooth Sensitive Skin Lotion  Nivea Body Creamy Conditioning Oil  Nivea Body Extra Enriched Teacher, adult education Moisturizing Lotion Nivea Crme  Nivea Skin Firming Lotion  NutraDerm 30 Skin Lotion  NutraDerm Skin Lotion  NutraDerm Therapeutic Skin Cream  NutraDerm Therapeutic Skin Lotion  ProShield Protective Hand Cream  Provon moisturizing lotion   View Pre-Surgery Education Videos:  [email or web address]    WHAT IS A BLOOD TRANSFUSION? Blood Transfusion Information  A transfusion is the replacement of blood or some of its parts. Blood is made up of multiple cells which provide different functions. Red blood cells carry oxygen and are used for blood loss replacement. White blood cells fight against infection. Platelets control bleeding. Plasma helps clot blood. Other blood products are available for specialized needs, such as hemophilia or other clotting disorders. BEFORE THE TRANSFUSION  Who gives blood for transfusions?  Healthy volunteers who are fully evaluated to make sure their blood is safe. This is blood bank blood. Transfusion therapy is the safest it has ever been in the  practice of medicine. Before blood is taken from a donor, a complete history is taken to make sure that person has no history of diseases nor engages in risky social behavior (examples are intravenous drug use or sexual activity with multiple partners). The donor's travel history is screened to minimize risk of transmitting infections, such as malaria. The donated blood is tested for signs of infectious diseases, such as HIV and hepatitis. The blood is then tested to be sure it is compatible with you in order to minimize the chance of a transfusion reaction. If you or a relative donates blood, this is often done in anticipation of surgery and is not appropriate for  emergency situations. It takes many days to process the donated blood. RISKS AND COMPLICATIONS Although transfusion therapy is very safe and saves many lives, the main dangers of transfusion include:  Getting an infectious disease. Developing a transfusion reaction. This is an allergic reaction to something in the blood you were given. Every precaution is taken to prevent this. The decision to have a blood transfusion has been considered carefully by your caregiver before blood is given. Blood is not given unless the benefits outweigh the risks. AFTER THE TRANSFUSION Right after receiving a blood transfusion, you will usually feel much better and more energetic. This is especially true if your red blood cells have gotten low (anemic). The transfusion raises the level of the red blood cells which carry oxygen, and this usually causes an energy increase. The nurse administering the transfusion will monitor you carefully for complications. HOME CARE INSTRUCTIONS  No special instructions are needed after a transfusion. You may find your energy is better. Speak with your caregiver about any limitations on activity for underlying diseases you may have. SEEK MEDICAL CARE IF:  Your condition is not improving after your transfusion. You develop redness or irritation at the intravenous (IV) site. SEEK IMMEDIATE MEDICAL CARE IF:  Any of the following symptoms occur over the next 12 hours: Shaking chills. You have a temperature by mouth above 102 F (38.9 C), not controlled by medicine. Chest, back, or muscle pain. People around you feel you are not acting correctly or are confused. Shortness of breath or difficulty breathing. Dizziness and fainting. You get a rash or develop hives. You have a decrease in urine output. Your urine turns a dark color or changes to pink, red, or brown. Any of the following symptoms occur over the next 10 days: You have a temperature by mouth above 102 F (38.9 C), not  controlled by medicine. Shortness of breath. Weakness after normal activity. The white part of the eye turns yellow (jaundice). You have a decrease in the amount of urine or are urinating less often. Your urine turns a dark color or changes to pink, red, or brown. Document Released: 10/23/2000 Document Revised: 01/18/2012 Document Reviewed: 06/11/2008 Exit[employer] Patient Information 2014 [place], [place].  _______________________________________________________________________  Incentive Spirometer  An incentive spirometer is a tool that can help keep your lungs clear and active. This tool measures how well you are filling your lungs with each breath. Taking long deep breaths may help reverse or decrease the chance of developing breathing (pulmonary) problems (especially infection) following: A long period of time when you are unable to move or be active. BEFORE THE PROCEDURE  If the spirometer includes an indicator to show your best effort, your nurse or respiratory therapist will set it to a desired goal. If possible, sit up straight or lean slightly forward. Try not to  slouch. Hold the incentive spirometer in an upright position. INSTRUCTIONS FOR USE  Sit on the edge of your bed if possible, or sit up as far as you can in bed or on a chair. Hold the incentive spirometer in an upright position. Breathe out normally. Place the mouthpiece in your mouth and seal your lips tightly around it. Breathe in slowly and as deeply as possible, raising the piston or the ball toward the top of the column. Hold your breath for 3-5 seconds or for as long as possible. Allow the piston or ball to fall to the bottom of the column. Remove the mouthpiece from your mouth and breathe out normally. Rest for a few seconds and repeat Steps 1 through 7 at least 10 times every 1-2 hours when you are awake. Take your time and take a few normal breaths between deep breaths. The spirometer may include an indicator to  show your best effort. Use the indicator as a goal to work toward during each repetition. After each set of 10 deep breaths, practice coughing to be sure your lungs are clear. If you have an incision (the cut made at the time of surgery), support your incision when coughing by placing a pillow or rolled up towels firmly against it. Once you are able to get out of bed, walk around indoors and cough well. You may stop using the incentive spirometer when instructed by your caregiver.  RISKS AND COMPLICATIONS Take your time so you do not get dizzy or light-headed. If you are in pain, you may need to take or ask for pain medication before doing incentive spirometry. It is harder to take a deep breath if you are having pain. AFTER USE Rest and breathe slowly and easily. It can be helpful to keep track of a log of your progress. Your caregiver can provide you with a simple table to help with this. If you are using the spirometer at home, follow these instructions: SEEK MEDICAL CARE IF:  You are having difficultly using the spirometer. You have trouble using the spirometer as often as instructed. Your pain medication is not giving enough relief while using the spirometer. You develop fever of 100.5 F (38.1 C) or higher. SEEK IMMEDIATE MEDICAL CARE IF:  You cough up bloody sputum that had not been present before. You develop fever of 102 F (38.9 C) or greater. You develop worsening pain at or near the incision site. MAKE SURE YOU:  Understand these instructions. Will watch your condition. Will get help right away if you are not doing well or get worse. Document Released: 03/08/2007 Document Revised: 01/18/2012 Document Reviewed: 05/09/2007 [facility] Patient Information 2014 [place], [place].   ________________________________________________________________________

## 2024-02-10 NOTE — Telephone Encounter (Signed)
 I have tried to call the surgeon's office x 4. Sounds like phone is answered though no one says anything. I am going to secure chat one of the surgery schedulers in the office for Dr. Magnus Ivan and see if she can help. Looks like we never received a clearance request.   Will need a request to be faxed STAT to [phone] preop team    FW: Hip Replacement Surgery Scheduled Received: Today Wittenborn, Gavin Pound, NP  P Cv Div Preop Callback Pre-op team,  Will you please assist me in getting Dr. Eliberto Ivory office to send in a request for this patient? It is now going to become a time crunch, as she will need a virtual visit and recommendations from pharmacy.  Thank you!  DW       Previous Messages  Hip Replacement Surgery Scheduled (Newest Message First)Wittenborn, Gavin Pound, NP to Cv Div Preop Callback     02/10/24  2:22 PM Pre-op team,  Will you please assist me in getting Dr. Eliberto Ivory office to send in a request for this patient? It is now going to become a time crunch, as she will need a virtual visit and recommendations from pharmacy.  Thank you!  DW   02/10/24  2:15 PM Jani Gravel, RN routed this conversation to Cv Div Preop Melony Overly to P Cv Div Burl Triage (supporting Sondra Barges, PA-C)      02/10/24  2:14 PM I requested the above info be sent to you from Dr Eliberto Ivory office a week ago.  Have you received it as yet?   I have a pre-op appointment tomorrow, Friday, April 4th at [facility]. Do you need anything from them? Melony Overly to P Cv Div Burl Triage (supporting Sondra Barges, PA-C)      02/01/24  2:54 PM I will send a request for the info you need.   Parke Poisson, RN to Melony Overly      02/01/24  2:52 PM Good afternoon,   Can you please have the surgeon office fax over the following information to [phone].       [facility] Pre-operative Risk Assessment    Request for surgical clearance:   What type  of surgery is being performed?     When is this surgery scheduled?     What type of clearance is required (medical clearance vs. Pharmacy clearance to hold med vs. Both)?    Are there any medications that need to be held prior to surgery and how long?    Practice name and name of physician performing surgery?     What is your office phone and fax number?     Anesthesia type (None, local, MAC, general) ?    Thanks, Elease Hashimoto, RN  Last read by Melony Overly at 2:06PM on 02/10/2024. Melony Overly to P Cv Div Burl Triage (supporting Sondra Barges, PA-C)      02/01/24  2:41 PM I have a hip replacement surgery scheduled at [facility] April 11th by Dr. Magnus Ivan.  Pre Op is scheduled at same [facility] April 4th. I'm on your prescribed meds Eliquis and Metoprolol.  I was told to let you know about these appointments and that I should stop the Eliquis 3 days before.  I was also told to ask you if there were any concerns about this procedure.

## 2024-02-10 NOTE — Progress Notes (Addendum)
 COVID Vaccine Completed: yes  Date of COVID positive in last 90 days:  PCP - Jacquenette Shone, DO LOV 01/03/24 Cardiologist - Julien Nordmann, MD LOV 06/08/23  Medical clearance by Jacquenette Shone, DO 01/03/24 in Epic   Chest x-ray -  EKG - 06/08/23 Epic Stress Test - 03/12/23 Epic ECHO - 11/12/22 Epic Cardiac Cath -  Pacemaker/ICD device last checked: Spinal Cord Stimulator:  Bowel Prep -   Sleep Study -  CPAP -   Fasting Blood Sugar -  Checks Blood Sugar _____ times a day  Last dose of GLP1 agonist-  N/A GLP1 instructions:  Hold 7 days before surgery    Last dose of SGLT-2 inhibitors-  N/A SGLT-2 instructions:  Hold 3 days before surgery    Blood Thinner Instructions:  Eliquis, hold 3 days Aspirin Instructions: Last Dose:  Activity level:  Can go up a flight of stairs and perform activities of daily living without stopping and without symptoms of chest pain or shortness of breath.  Able to exercise without symptoms  Unable to go up a flight of stairs without symptoms of     Anesthesia review: a fib, CAD, HTN, preDM, dysphagia   Patient denies shortness of breath, fever, cough and chest pain at PAT appointment  Patient verbalized understanding of instructions that were given to them at the PAT appointment. Patient was also instructed that they will need to review over the PAT instructions again at home before surgery.

## 2024-02-11 ENCOUNTER — Telehealth (INDEPENDENT_AMBULATORY_CARE_PROVIDER_SITE_OTHER): Admitting: Emergency Medicine

## 2024-02-11 ENCOUNTER — Encounter (HOSPITAL_COMMUNITY): Payer: Self-pay

## 2024-02-11 ENCOUNTER — Other Ambulatory Visit: Payer: Self-pay

## 2024-02-11 ENCOUNTER — Encounter (HOSPITAL_COMMUNITY)
Admission: RE | Admit: 2024-02-11 | Discharge: 2024-02-11 | Disposition: A | Discharge status: Home / Self Care | Source: Ambulatory Visit | Attending: Orthopaedic Surgery | Admitting: Orthopaedic Surgery

## 2024-02-11 ENCOUNTER — Telehealth: Payer: Self-pay | Admitting: *Deleted

## 2024-02-11 VITALS — BP 157/68 | HR 63 | Temp 98.4°F | Resp 14 | Ht 61.0 in | Wt 156.0 lb

## 2024-02-11 DIAGNOSIS — I6522 Occlusion and stenosis of left carotid artery: Secondary | ICD-10-CM | POA: Diagnosis not present

## 2024-02-11 DIAGNOSIS — I700 Atherosclerosis of aorta: Secondary | ICD-10-CM | POA: Insufficient documentation

## 2024-02-11 DIAGNOSIS — I4719 Other supraventricular tachycardia: Secondary | ICD-10-CM | POA: Diagnosis not present

## 2024-02-11 DIAGNOSIS — Z01818 Encounter for other preprocedural examination: Secondary | ICD-10-CM | POA: Diagnosis not present

## 2024-02-11 DIAGNOSIS — K449 Diaphragmatic hernia without obstruction or gangrene: Secondary | ICD-10-CM | POA: Insufficient documentation

## 2024-02-11 DIAGNOSIS — Z7901 Long term (current) use of anticoagulants: Secondary | ICD-10-CM | POA: Insufficient documentation

## 2024-02-11 DIAGNOSIS — I480 Paroxysmal atrial fibrillation: Secondary | ICD-10-CM | POA: Diagnosis not present

## 2024-02-11 DIAGNOSIS — Z01810 Encounter for preprocedural cardiovascular examination: Secondary | ICD-10-CM | POA: Diagnosis not present

## 2024-02-11 DIAGNOSIS — K219 Gastro-esophageal reflux disease without esophagitis: Secondary | ICD-10-CM | POA: Insufficient documentation

## 2024-02-11 DIAGNOSIS — I10 Essential (primary) hypertension: Secondary | ICD-10-CM | POA: Diagnosis not present

## 2024-02-11 DIAGNOSIS — E785 Hyperlipidemia, unspecified: Secondary | ICD-10-CM | POA: Diagnosis not present

## 2024-02-11 DIAGNOSIS — M1611 Unilateral primary osteoarthritis, right hip: Secondary | ICD-10-CM | POA: Insufficient documentation

## 2024-02-11 DIAGNOSIS — R7303 Prediabetes: Secondary | ICD-10-CM | POA: Diagnosis not present

## 2024-02-11 DIAGNOSIS — E119 Type 2 diabetes mellitus without complications: Secondary | ICD-10-CM

## 2024-02-11 DIAGNOSIS — Z01812 Encounter for preprocedural laboratory examination: Secondary | ICD-10-CM | POA: Diagnosis not present

## 2024-02-11 HISTORY — DX: Other complications of anesthesia, initial encounter: T88.59XA

## 2024-02-11 LAB — BASIC METABOLIC PANEL WITH GFR
Anion gap: 8 (ref 5–15)
BUN: 20 mg/dL (ref 8–23)
CO2: 25 mmol/L (ref 22–32)
Calcium: 9.2 mg/dL (ref 8.9–10.3)
Chloride: 106 mmol/L (ref 98–111)
Creatinine, Ser: 0.75 mg/dL (ref 0.44–1.00)
GFR, Estimated: >60 mL/min (ref >60)
Glucose, Bld: 99 mg/dL (ref 70–99)
Potassium: 4 mmol/L (ref 3.5–5.1)
Sodium: 139 mmol/L (ref 135–145)

## 2024-02-11 LAB — CBC
HCT: 43 % (ref 36.0–46.0)
Hemoglobin: 13.8 g/dL (ref 12.0–15.0)
MCH: 27.8 pg (ref 26.0–34.0)
MCHC: 32.1 g/dL (ref 30.0–36.0)
MCV: 86.5 fL (ref 80.0–100.0)
Platelets: 166 10*3/uL (ref 150–400)
RBC: 4.97 MIL/uL (ref 3.87–5.11)
RDW: 13.2 % (ref 11.5–15.5)
WBC: 6.7 10*3/uL (ref 4.0–10.5)
nRBC: 0 % (ref 0.0–0.2)

## 2024-02-11 LAB — SURGICAL PCR SCREEN
MRSA, PCR: NEGATIVE
Staphylococcus aureus: NEGATIVE

## 2024-02-11 NOTE — Telephone Encounter (Signed)
 Left message to call back ASAP to schedule tele appt today.

## 2024-02-11 NOTE — Telephone Encounter (Signed)
   Name: [facility]  DOB: 1941/05/08  MRN: [id number]  Primary Cardiologist: Julien Nordmann, MD   Preoperative team, please contact this patient and set up a phone call appointment for further preoperative risk assessment. Please obtain consent and complete medication review. Thank you for your help.  I confirm that guidance regarding antiplatelet and oral anticoagulation therapy has been completed and, if necessary, noted below.  Awaiting pharmacy recommendations for holding Eliquis.   I also confirmed the patient resides in the state of [place]. As per [facility] Medical Board telemedicine laws, the patient must reside in the state in which the provider is licensed.   Carlos Levering, NP 02/11/2024, 8:57 AM [facility]

## 2024-02-11 NOTE — Telephone Encounter (Signed)
 Patient with diagnosis of PAF on Elquis for anticoagulation.    Procedure: RIGHT TOTAL HIP ARTHROPLASTY  Date of procedure: 02/18/24   CHA2DS2-VASc Score = 6  This indicates a 9.7% annual risk of stroke. The patient's score is based upon: CHF History: 0 HTN History: 1 Diabetes History: 1 Stroke History: 0 Vascular Disease History: 1 Age Score: 2 Gender Score: 1    CrCl 58 ml/min Platelet count no current CBC (appears to have been ordered today)   Per office protocol, patient can hold Eliquis for 3 days prior to procedure.  **This guidance is not considered finalized until pre-operative APP has relayed final recommendations.**

## 2024-02-11 NOTE — Telephone Encounter (Signed)
 Please advise holding Eliquis prior to right total hip arthroplasty on 4/11.   Thank you!  DW

## 2024-02-11 NOTE — Progress Notes (Signed)
 Virtual Visit via Telephone Note   Because of Charlene Norris co-morbid illnesses, she is at least at moderate risk for complications without adequate follow up.  This format is felt to be most appropriate for this patient at this time.  Due to technical limitations with video connection (technology), today's appointment will be conducted as an audio only telehealth visit, and Charlene GUADIANA verbally agreed to proceed in this manner.   All issues noted in this document were discussed and addressed.  No physical exam could be performed with this format.  Evaluation Performed:  Preoperative cardiovascular risk assessment _____________   Date:  02/11/2024   Patient ID:  Charlene Norris, DOB 1941/11/03, MRN [id number] Patient Location:  Home Provider location:   Office  Primary Care Provider:  Sherlyn Hay, DO Primary Cardiologist:  Julien Nordmann, MD  Chief Complaint / Patient Profile   83 y.o. y/o female with a h/o paroxysmal atrial fibrillation, pancreatitis, PSVT, syncope, left-sided carotid artery stenosis, hiatal hernia, hypertension, aortic atherosclerosis, hyperlipidemia, prediabetes, GERD who is pending right total hip arthroplasty on 02/18/2024 by Dr. Magnus Ivan with [facility] at [facility] and presents today for telephonic preoperative cardiovascular risk assessment.  History of Present Illness    Charlene Norris is a 83 y.o. female who presents via audio/video conferencing for a telehealth visit today.  Pt was last seen in cardiology clinic on 06/08/2023 by Eula Listen, PA.  At that time Charlene Norris was doing well.  The patient is now pending procedure as outlined above. Since her last visit, she denies chest pain, shortness of breath, lower extremity edema, fatigue, palpitations, melena, hematuria, hemoptysis, diaphoresis, weakness, presyncope, syncope, orthopnea, and PND.  Past Medical History    Past Medical History:  Diagnosis Date   Allergy ?   Years ago and one  recently.   Anxiety ?   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 or so years ago   UTI (urinary tract infection)    Past Surgical History:  Procedure Laterality Date   ABDOMINAL HYSTERECTOMY     partial   APPENDECTOMY N/A    CHOLECYSTECTOMY N/A 07/24/2015   Procedure: LAPAROSCOPIC CHOLECYSTECTOMY CONVERTED TO OPEN CHOLECYSTECTOMY ;  Surgeon: Kieth Brightly, MD;  Location: [facility] ORS;  Service: General;  Laterality: N/A;   ERCP N/A 12/30/2021   Procedure: ENDOSCOPIC RETROGRADE CHOLANGIOPANCREATOGRAPHY (ERCP);  Surgeon: Midge Minium, MD;  Location: [facility] ENDOSCOPY;  Service: Endoscopy;  Laterality: N/A;   EXPLORATORY LAPAROTOMY     JOINT REPLACEMENT Bilateral 2009, 2010   knee   PARATHYROIDECTOMY Right 06/10/2011   growth/ Dr Willeen Cass   PARTIAL HYSTERECTOMY      Allergies  Allergies  Allergen Reactions   Levofloxacin Shortness Of Breath    Chest pressure, head pressure   Ampicillin Itching   Celebrex [Celecoxib]  Hives   Nexium [Esomeprazole Magnesium] Hives and Diarrhea   Ranitidine Itching   Strawberry (Diagnostic) Hives   Zyrtec [Cetirizine] Diarrhea    Feel ill    Home Medications    Prior to Admission medications   Medication Sig Start Date End Date Taking? Authorizing Provider  acetaminophen (TYLENOL) 325 MG tablet Take 325 mg by mouth every 8 (eight) hours as needed for moderate pain (pain  score 4-6).    [provider]  denosumab (PROLIA) 60 MG/ML SOSY injection Inject 60 mg into the skin every 6 (six) months. Most recent injection 03/15/2023    [provider]  ELIQUIS 5 MG TABS tablet TAKE 1 TABLET(5 MG) BY MOUTH TWICE DAILY 08/24/23   Antonieta Iba, MD  ibuprofen (ADVIL) 200 MG tablet Take 325 mg by mouth every 8 (eight) hours as needed for moderate pain (pain score 4-6) or mild pain (pain score 1-3).    [provider]  lisinopril (ZESTRIL) 10 MG tablet Take 10 mg by mouth daily.    [provider]  lovastatin (MEVACOR) 40 MG tablet TAKE 1 TABLET(40 MG) BY MOUTH IN THE MORNING 02/08/24   Pardue, Monico Blitz, DO  metoprolol succinate (TOPROL-XL) 25 MG 24 hr tablet TAKE 1/2 TABLET(12.5 MG) BY MOUTH DAILY 12/13/23   Dunn, Raymon Mutton, PA-C  Misc Natural Products (GLUCOSAMINE CHOND MSM FORMULA PO) Take 1 tablet by mouth daily as needed (Patient Prefrence).    [provider]  Vitamin D, Ergocalciferol, (DRISDOL) 1.25 MG (50000 UNIT) CAPS capsule Take 1 capsule (50,000 Units total) by mouth every 7 (seven) days. 01/03/24   Sherlyn Hay, DO  Zinc 10 MG LOZG Use as directed 1 each in the mouth or throat as needed (cold symptoms).    [provider]    Physical Exam    Vital Signs:  Charlene Norris does not have vital signs available for review today.  Given telephonic nature of communication, physical exam is limited. AAOx3. NAD. Normal affect.  Speech and respirations are unlabored.  Accessory Clinical Findings    None  Assessment & Plan    1.  Preoperative Cardiovascular Risk Assessment: According to the Revised Cardiac Risk Index (RCRI), her Perioperative Risk of Major Cardiac Event is (%): 0.4. Her Functional Capacity in METs is: 5.04 according to the Duke Activity Status Index (DASI). Therefore, based on ACC/AHA guidelines, patient would be at acceptable risk for the planned procedure without further cardiovascular  testing.  The patient was advised that if she develops new symptoms prior to surgery to contact our office to arrange for a follow-up visit, and she verbalized understanding.  Per office protocol, patient can hold Eliquis for 3 days prior to procedure.   A copy of this note will be routed to requesting surgeon.  Time:   Today, I have spent 8 minutes with the patient with telehealth technology discussing medical history, symptoms, and management plan.     Denyce Robert, NP  02/11/2024, 1:35 PM

## 2024-02-11 NOTE — Telephone Encounter (Signed)
 I s/w pt and she has been added on today due to procedure date and blood thinner hold. Med rec and consent are done.      Patient Consent for Virtual Visit        Charlene Norris has provided verbal consent on 02/11/2024 for a virtual visit (video or telephone).   CONSENT FOR VIRTUAL VISIT FOR:  Charlene Norris  By participating in this virtual visit I agree to the following:  I hereby voluntarily request, consent and authorize [facility] and its employed or contracted physicians, physician assistants, nurse practitioners or other licensed health care professionals (the Practitioner), to provide me with telemedicine health care services (the "Services") as deemed necessary by the treating Practitioner. I acknowledge and consent to receive the Services by the Practitioner via telemedicine. I understand that the telemedicine visit will involve communicating with the Practitioner through live audiovisual communication technology and the disclosure of certain medical information by electronic transmission. I acknowledge that I have been given the opportunity to request an in-person assessment or other available alternative prior to the telemedicine visit and am voluntarily participating in the telemedicine visit.  I understand that I have the right to withhold or withdraw my consent to the use of telemedicine in the course of my care at any time, without affecting my right to future care or treatment, and that the Practitioner or I may terminate the telemedicine visit at any time. I understand that I have the right to inspect all information obtained and/or recorded in the course of the telemedicine visit and may receive copies of available information for a reasonable fee.  I understand that some of the potential risks of receiving the Services via telemedicine include:  Delay or interruption in medical evaluation due to technological equipment failure or disruption; Information transmitted  may not be sufficient (e.g. poor resolution of images) to allow for appropriate medical decision making by the Practitioner; and/or  In rare instances, security protocols could fail, causing a breach of personal health information.  Furthermore, I acknowledge that it is my responsibility to provide information about my medical history, conditions and care that is complete and accurate to the best of my ability. I acknowledge that Practitioner's advice, recommendations, and/or decision may be based on factors not within their control, such as incomplete or inaccurate data provided by me or distortions of diagnostic images or specimens that may result from electronic transmissions. I understand that the practice of medicine is not an exact science and that Practitioner makes no warranties or guarantees regarding treatment outcomes. I acknowledge that a copy of this consent can be made available to me via my patient portal [facility] MyChart), or I can request a printed copy by calling the office of [facility].    I understand that my insurance will be billed for this visit.   I have read or had this consent read to me. I understand the contents of this consent, which adequately explains the benefits and risks of the Services being provided via telemedicine.  I have been provided ample opportunity to ask questions regarding this consent and the Services and have had my questions answered to my satisfaction. I give my informed consent for the services to be provided through the use of telemedicine in my medical care

## 2024-02-11 NOTE — Telephone Encounter (Signed)
 I s/w pt and she has been added on today due to procedure date and blood thinner hold. Med rec and consent are done.

## 2024-02-11 NOTE — Telephone Encounter (Signed)
 OUR OFFICE JUST RECEIVED THE CLEARANCE REQUEST TODAY FOR SURGERY 02/18/24.     Pre-operative Risk Assessment    Patient Name: Charlene Norris  DOB: December 05, 1940 MRN: [id number]   Date of last office visit: 06/08/23 Eula Listen, [facility] Date of next office visit: NONE   Request for Surgical Clearance    Procedure:   RIGHT TOTAL HIP ARTHROPLASTY  Date of Surgery:  Clearance 02/18/24                                Surgeon:  DR. Doneen Poisson Surgeon's Group or Practice Name:  [facility] Phone number:  [phone] Fax number:  [phone] [facility] [employer]   Type of Clearance Requested:   - Medical  - Pharmacy:  Hold Apixaban (Eliquis) x 3 DAYS PRIOR   Type of Anesthesia:  Spinal   Additional requests/questions:    Elpidio Anis   02/11/2024, 8:49 AM

## 2024-02-12 LAB — HEMOGLOBIN A1C
Hgb A1c MFr Bld: 5.4 % (ref 4.8–5.6)
Mean Plasma Glucose: 108 mg/dL

## 2024-02-13 ENCOUNTER — Other Ambulatory Visit: Payer: Self-pay | Admitting: Family Medicine

## 2024-02-13 DIAGNOSIS — I10 Essential (primary) hypertension: Secondary | ICD-10-CM

## 2024-02-13 MED ORDER — LISINOPRIL 20 MG PO TABS: 20.0000 mg | ORAL_TABLET | Freq: Every day | ORAL | 3 refills | Status: DC

## 2024-02-14 NOTE — Progress Notes (Signed)
 Anesthesia Chart Review:  83 year old female follows with cardiology for history of paroxysmal atrial fibrillation, PSVT, syncope, left-sided carotid artery stenosis, hypertension, aortic atherosclerosis, hyperlipidemia.  Nuclear stress 03/12/2023 was low risk.  Seen by Rise Paganini, NP on 02/11/2024 for preop evaluation.  Per note, "According to the Revised Cardiac Risk Index (RCRI), her Perioperative Risk of Major Cardiac Event is (%): 0.4. Her Functional Capacity in METs is: 5.04 according to the Duke Activity Status Index (DASI). Therefore, based on ACC/AHA guidelines, patient would be at acceptable risk for the planned procedure without further cardiovascular testing. The patient was advised that if she develops new symptoms prior to surgery to contact our office to arrange for a follow-up visit, and she verbalized understanding. Per office protocol, patient can hold Eliquis for 3 days prior to procedure."  Patient reports instructed to hold Eliquis for 3 days.  Other pertinent history includes hiatal hernia, prediabetes, GERD, pancreatitis  Preop labs reviewed, WNL.  EKG 06/08/2023: NSR, Rate 65, LVH with early repolarization abnormality, consistent with prior tracing  Nuclear stress 03/12/23: Pharmacological myocardial perfusion imaging study with no significant  ischemia Normal wall motion, EF estimated at 51% No EKG changes concerning for ischemia at peak stress or in recovery. CT attenuation correction images with mild aortic atherosclerosis Low risk scan  TTE 11/12/2022: Conclusion: Left ventricular cavity is normal in size.  Normal systolic global function of left ventricle.  Calculated left ventricular EF 61.4%.  MR, TR, trace AI.    Zannie Cove [facility] Stay [facility]/Anesthesiology Phone [phone] 02/14/2024 12:46 PM

## 2024-02-14 NOTE — Anesthesia Preprocedure Evaluation (Signed)
 Anesthesia Evaluation  Patient identified by MRN, date of birth, ID band Patient awake    Reviewed: Allergy & Precautions, NPO status , Patient's Chart, lab work & pertinent test results  History of Anesthesia Complications Negative for: history of anesthetic complications  Airway Mallampati: II  TM Distance: >3 FB Neck ROM: Full    Dental  (+) Missing, Poor Dentition, Chipped, Dental Advisory Given   Pulmonary neg sleep apnea, neg COPD, Patient abstained from smoking.Not current smoker, former smoker   Pulmonary exam normal breath sounds clear to auscultation       Cardiovascular Exercise Tolerance: Good METShypertension, Pt. on medications and Pt. on home beta blockers (-) CAD and (-) Past MI Normal cardiovascular exam+ dysrhythmias Atrial Fibrillation  Rhythm:Regular Rate:Normal - Systolic murmurs    Neuro/Psych   Anxiety     negative neurological ROS  negative psych ROS   GI/Hepatic ,GERD  ,,(+)     (-) substance abuse    Endo/Other  neg diabetes    Renal/GU negative Renal ROS     Musculoskeletal  (+) Arthritis ,    Abdominal  (+) - obese  Peds  Hematology   Anesthesia Other Findings   Reproductive/Obstetrics                             Anesthesia Physical Anesthesia Plan  ASA: 3  Anesthesia Plan: Spinal   Post-op Pain Management: Tylenol PO (pre-op)*   Induction: Intravenous  PONV Risk Score and Plan: 3 and Propofol infusion, TIVA, Ondansetron, Treatment may vary due to age or medical condition and Dexamethasone  Airway Management Planned: Natural Airway  Additional Equipment: None  Intra-op Plan:   Post-operative Plan:   Informed Consent: I have reviewed the patients History and Physical, chart, labs and discussed the procedure including the risks, benefits and alternatives for the proposed anesthesia with the patient or authorized representative who has indicated  his/her understanding and acceptance.     Dental advisory given  Plan Discussed with: CRNA  Anesthesia Plan Comments: (Last dose of eliquis PM 02/13/2024   PAT note by Antionette Poles, PA-C: 83 year old female follows with cardiology for history of paroxysmal atrial fibrillation, PSVT, syncope, left-sided carotid artery stenosis, hypertension, aortic atherosclerosis, hyperlipidemia.  Nuclear stress 03/12/2023 was low risk.  Seen by Rise Paganini, NP on 02/11/2024 for preop evaluation.  Per note, "According to the Revised Cardiac Risk Index (RCRI), her Perioperative Risk of Major Cardiac Event is (%): 0.4. Her Functional Capacity in METs is: 5.04 according to the Duke Activity Status Index (DASI). Therefore, based on ACC/AHA guidelines, patient would be at acceptable risk for the planned procedure without further cardiovascular testing. The patient was advised that if she develops new symptoms prior to surgery to contact our office to arrange for a follow-up visit, and she verbalized understanding. Per office protocol, patient can hold Eliquis for 3 days prior to procedure."  Patient reports instructed to hold Eliquis for 3 days.  Other pertinent history includes hiatal hernia, prediabetes, GERD, pancreatitis  Preop labs reviewed, WNL.  EKG 06/08/2023: NSR, Rate 65, LVH with early repolarization abnormality, consistent with prior tracing  Nuclear stress 03/12/23: Pharmacological myocardial perfusion imaging study with no significant  ischemia Normal wall motion, EF estimated at 51% No EKG changes concerning for ischemia at peak stress or in recovery. CT attenuation correction images with mild aortic atherosclerosis Low risk scan  TTE 11/12/2022: Conclusion: Left ventricular cavity is normal in size.  Normal  systolic global function of left ventricle.  Calculated left ventricular EF 61.4%.  MR, TR, trace AI.  )        Anesthesia Quick Evaluation

## 2024-02-16 DIAGNOSIS — R531 Weakness: Secondary | ICD-10-CM | POA: Diagnosis not present

## 2024-02-16 DIAGNOSIS — E860 Dehydration: Secondary | ICD-10-CM | POA: Diagnosis not present

## 2024-02-16 DIAGNOSIS — R112 Nausea with vomiting, unspecified: Secondary | ICD-10-CM | POA: Diagnosis not present

## 2024-02-16 DIAGNOSIS — R079 Chest pain, unspecified: Secondary | ICD-10-CM | POA: Diagnosis not present

## 2024-02-17 NOTE — H&P (Signed)
 TOTAL HIP ADMISSION H&P  Patient is admitted for right total hip arthroplasty.  Subjective:  Chief Complaint: right hip pain  HPI: Charlene Norris, 83 y.o. female, has a history of pain and functional disability in the right hip(s) due to arthritis and patient has failed non-surgical conservative treatments for greater than 12 weeks to include use of assistive devices and activity modification.  Onset of symptoms was gradual starting several years ago with gradually worsening course since that time.The patient noted no past surgery on the right hip(s).  Patient currently rates pain in the right hip at 10 out of 10 with activity. Patient has night pain, worsening of pain with activity and weight bearing, trendelenberg gait, pain that interfers with activities of daily living, and pain with passive range of motion. Patient has evidence of subchondral sclerosis, periarticular osteophytes, and joint space narrowing by imaging studies. This condition presents safety issues increasing the risk of falls.  There is no current active infection.  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   Years ago and one recently.   Anxiety ?   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 or so years ago   UTI (urinary tract infection)     Past Surgical History:  Procedure Laterality Date   ABDOMINAL HYSTERECTOMY     partial   APPENDECTOMY N/A    CHOLECYSTECTOMY N/A 07/24/2015   Procedure: LAPAROSCOPIC CHOLECYSTECTOMY CONVERTED TO OPEN CHOLECYSTECTOMY ;  Surgeon: Kieth Brightly, MD;  Location: [facility] ORS;  Service: General;  Laterality: N/A;   ERCP N/A 12/30/2021   Procedure: ENDOSCOPIC RETROGRADE CHOLANGIOPANCREATOGRAPHY (ERCP);  Surgeon: Midge Minium, MD;  Location: [facility] ENDOSCOPY;  Service: Endoscopy;  Laterality: N/A;   EXPLORATORY LAPAROTOMY     JOINT REPLACEMENT Bilateral 2009, 2010   knee   PARATHYROIDECTOMY Right 06/10/2011   growth/ Dr Willeen Cass   PARTIAL HYSTERECTOMY  No current facility-administered medications for this encounter.   Current Outpatient Medications  Medication Sig Dispense Refill Last Dose/Taking   acetaminophen (TYLENOL) 325 MG tablet Take 325 mg by mouth every 8 (eight) hours as needed for moderate pain (pain score 4-6).   Taking As Needed   denosumab (PROLIA) 60 MG/ML SOSY injection Inject 60 mg into the skin every 6 (six) months. Most recent injection  03/15/2023   Taking   ELIQUIS 5 MG TABS tablet TAKE 1 TABLET(5 MG) BY MOUTH TWICE DAILY 60 tablet 5 Taking   ibuprofen (ADVIL) 200 MG tablet Take 200 mg by mouth every 8 (eight) hours as needed for moderate pain (pain score 4-6) or mild pain (pain score 1-3). Advil   Taking As Needed   lisinopril (ZESTRIL) 20 MG tablet Take 1 tablet (20 mg total) by mouth daily. 90 tablet 3 Taking   metoprolol succinate (TOPROL-XL) 25 MG 24 hr tablet TAKE 1/2 TABLET(12.5 MG) BY MOUTH DAILY 45 tablet 1 Taking   Misc Natural Products (GLUCOSAMINE CHOND MSM FORMULA PO) Take 1 tablet by mouth daily as needed (Patient Prefrence).   Taking As Needed   Vitamin D, Ergocalciferol, (DRISDOL) 1.25 MG (50000 UNIT) CAPS capsule Take 1 capsule (50,000 Units total) by mouth every 7 (seven) days. 12 capsule 3 Taking   Zinc 10 MG LOZG Use as directed 1 each in the mouth or throat as needed (cold symptoms).   Taking As Needed   lovastatin (MEVACOR) 40 MG tablet TAKE 1 TABLET(40 MG) BY MOUTH IN THE MORNING 90 tablet 0 Taking   Allergies  Allergen Reactions   Levofloxacin Shortness Of Breath    Chest pressure, head pressure   Ampicillin Itching   Celebrex [Celecoxib] Hives   Nexium [Esomeprazole Magnesium] Hives and Diarrhea   Ranitidine Itching   Strawberry (Diagnostic) Hives   Zyrtec [Cetirizine] Diarrhea    Feel ill    Social History   Tobacco Use   Smoking status: Former    Current packs/day: 0.00    Types: Cigarettes    Start date: 11/10/1963    Quit date: 11/09/1968    Years since quitting: 55.3   Smokeless tobacco: Never   Tobacco comments:    Quit smoking about 35 years ago.  Substance Use Topics   Alcohol use: No    Family History  Problem Relation Age of Onset   Arthritis Mother    Parkinson's disease Mother    Depression Mother    Diabetes Father    Hypertension Father    Stroke Father    Arthritis Father    Hearing loss Father    Heart disease Father    Breast cancer Sister 59       DCIS    Cancer Sister      Review of Systems  Objective:  Physical Exam Vitals reviewed.  Constitutional:      Appearance: Normal appearance. She is normal weight.  HENT:     Head: Normocephalic and atraumatic.  Eyes:     Extraocular Movements: Extraocular movements intact.     Pupils: Pupils are equal, round, and reactive to light.  Cardiovascular:     Rate and Rhythm: Normal rate. Rhythm irregular.  Pulmonary:     Effort: Pulmonary effort is normal.     Breath sounds: Normal breath sounds.  Abdominal:     Palpations: Abdomen is soft.  Musculoskeletal:     Cervical back: Normal range of motion and neck supple.     Right hip: Tenderness and  bony tenderness present. Decreased range of motion. Decreased strength.  Neurological:     Mental Status: She is alert and oriented to person, place, and time.  Psychiatric:        Behavior: Behavior normal.     Vital signs in last 24 hours:    Labs:   Estimated body mass index is 29.48 kg/m as calculated from the following:   Height as of 02/11/24: 5\' 1"  (1.549 m).   Weight as of 02/11/24: 70.8 kg.   Imaging Review Plain radiographs demonstrate severe degenerative joint disease of the right hip(s). The bone quality appears to be good for age and reported activity level.      Assessment/Plan:  End stage arthritis, right hip(s)  The patient history, physical examination, clinical judgement of the provider and imaging studies are consistent with end stage degenerative joint disease of the right hip(s) and total hip arthroplasty is deemed medically necessary. The treatment options including medical management, injection therapy, arthroscopy and arthroplasty were discussed at length. The risks and benefits of total hip arthroplasty were presented and reviewed. The risks due to aseptic loosening, infection, stiffness, dislocation/subluxation,  thromboembolic complications and other imponderables were discussed.  The patient acknowledged the  explanation, agreed to proceed with the plan and consent was signed. Patient is being admitted for inpatient treatment for surgery, pain control, PT, OT, prophylactic antibiotics, VTE prophylaxis, progressive ambulation and ADL's and discharge planning.The patient is planning to be discharged home with home health services

## 2024-02-18 ENCOUNTER — Ambulatory Visit (HOSPITAL_COMMUNITY)

## 2024-02-18 ENCOUNTER — Other Ambulatory Visit: Payer: Self-pay

## 2024-02-18 ENCOUNTER — Encounter (HOSPITAL_COMMUNITY): Payer: Self-pay | Admitting: Orthopaedic Surgery

## 2024-02-18 ENCOUNTER — Encounter (HOSPITAL_COMMUNITY)
Admission: RE | Disposition: A | Discharge status: Home / Self Care | Payer: Self-pay | Source: Home / Self Care | Attending: Orthopaedic Surgery

## 2024-02-18 ENCOUNTER — Ambulatory Visit (HOSPITAL_COMMUNITY): Payer: Self-pay | Admitting: Physician Assistant

## 2024-02-18 ENCOUNTER — Observation Stay (HOSPITAL_COMMUNITY)
Admission: RE | Admit: 2024-02-18 | Discharge: 2024-02-19 | Disposition: A | Discharge status: Home / Self Care | Attending: Orthopaedic Surgery | Admitting: Orthopaedic Surgery

## 2024-02-18 ENCOUNTER — Ambulatory Visit (HOSPITAL_BASED_OUTPATIENT_CLINIC_OR_DEPARTMENT_OTHER): Payer: Self-pay | Admitting: Certified Registered"

## 2024-02-18 ENCOUNTER — Observation Stay (HOSPITAL_COMMUNITY)

## 2024-02-18 DIAGNOSIS — Z96653 Presence of artificial knee joint, bilateral: Secondary | ICD-10-CM | POA: Insufficient documentation

## 2024-02-18 DIAGNOSIS — Z87891 Personal history of nicotine dependence: Secondary | ICD-10-CM | POA: Diagnosis not present

## 2024-02-18 DIAGNOSIS — I480 Paroxysmal atrial fibrillation: Secondary | ICD-10-CM | POA: Diagnosis not present

## 2024-02-18 DIAGNOSIS — Z96641 Presence of right artificial hip joint: Secondary | ICD-10-CM

## 2024-02-18 DIAGNOSIS — E119 Type 2 diabetes mellitus without complications: Secondary | ICD-10-CM | POA: Insufficient documentation

## 2024-02-18 DIAGNOSIS — I4891 Unspecified atrial fibrillation: Secondary | ICD-10-CM | POA: Insufficient documentation

## 2024-02-18 DIAGNOSIS — I10 Essential (primary) hypertension: Secondary | ICD-10-CM | POA: Insufficient documentation

## 2024-02-18 DIAGNOSIS — M1611 Unilateral primary osteoarthritis, right hip: Principal | ICD-10-CM | POA: Insufficient documentation

## 2024-02-18 DIAGNOSIS — Z7901 Long term (current) use of anticoagulants: Secondary | ICD-10-CM | POA: Diagnosis not present

## 2024-02-18 DIAGNOSIS — Z79899 Other long term (current) drug therapy: Secondary | ICD-10-CM | POA: Diagnosis not present

## 2024-02-18 DIAGNOSIS — Z471 Aftercare following joint replacement surgery: Secondary | ICD-10-CM | POA: Diagnosis not present

## 2024-02-18 HISTORY — PX: TOTAL HIP ARTHROPLASTY: SHX124

## 2024-02-18 LAB — TYPE AND SCREEN
ABO/RH(D): AB NEG
Antibody Screen: NEGATIVE

## 2024-02-18 LAB — ABO/RH: ABO/RH(D): AB NEG

## 2024-02-18 SURGERY — ARTHROPLASTY, HIP, TOTAL, ANTERIOR APPROACH
Anesthesia: Spinal | Site: Hip | Laterality: Right

## 2024-02-18 MED ORDER — DEXMEDETOMIDINE HCL IN NACL 80 MCG/20ML IV SOLN
INTRAVENOUS | Status: AC
  Filled 2024-02-18: qty 20

## 2024-02-18 MED ORDER — APIXABAN 5 MG PO TABS
5.0000 mg | ORAL_TABLET | Freq: Two times a day (BID) | ORAL | Status: DC
Start: 2024-02-19 — End: 2024-02-19
  Administered 2024-02-19: 5 mg via ORAL
  Filled 2024-02-18: qty 1

## 2024-02-18 MED ORDER — LIDOCAINE HCL (PF) 2 % IJ SOLN
INTRAMUSCULAR | Status: AC
  Filled 2024-02-18: qty 5

## 2024-02-18 MED ORDER — BUPIVACAINE IN DEXTROSE 0.75-8.25 % IT SOLN
INTRATHECAL | Status: DC | PRN
Start: 2024-02-18 — End: 2024-02-18
  Administered 2024-02-18: 1.6 mL via INTRATHECAL

## 2024-02-18 MED ORDER — ONDANSETRON HCL 4 MG/2ML IJ SOLN: 4.0000 mg | Freq: Four times a day (QID) | INTRAMUSCULAR | Status: DC | PRN

## 2024-02-18 MED ORDER — CHLORHEXIDINE GLUCONATE 0.12 % MT SOLN
15.0000 mL | Freq: Once | OROMUCOSAL | Status: AC
  Administered 2024-02-18: 15 mL via OROMUCOSAL

## 2024-02-18 MED ORDER — ONDANSETRON HCL 4 MG/2ML IJ SOLN
INTRAMUSCULAR | Status: AC
  Filled 2024-02-18: qty 2

## 2024-02-18 MED ORDER — TRANEXAMIC ACID-NACL 1000-0.7 MG/100ML-% IV SOLN
1000.0000 mg | INTRAVENOUS | Status: AC
  Administered 2024-02-18: 1000 mg via INTRAVENOUS
  Filled 2024-02-18: qty 100

## 2024-02-18 MED ORDER — METHOCARBAMOL 1000 MG/10ML IJ SOLN: 500.0000 mg | Freq: Four times a day (QID) | INTRAMUSCULAR | Status: DC | PRN

## 2024-02-18 MED ORDER — DEXMEDETOMIDINE HCL IN NACL 80 MCG/20ML IV SOLN
INTRAVENOUS | Status: DC | PRN
  Administered 2024-02-18 (×2): 4 ug via INTRAVENOUS

## 2024-02-18 MED ORDER — ALUM & MAG HYDROXIDE-SIMETH 200-200-20 MG/5ML PO SUSP: 30.0000 mL | ORAL | Status: DC | PRN

## 2024-02-18 MED ORDER — DIPHENHYDRAMINE HCL 12.5 MG/5ML PO ELIX
12.5000 mg | ORAL_SOLUTION | ORAL | Status: DC | PRN
  Filled 2024-02-18: qty 10

## 2024-02-18 MED ORDER — HYDROMORPHONE HCL 1 MG/ML IJ SOLN
0.2500 mg | INTRAMUSCULAR | Status: DC | PRN
  Administered 2024-02-18: 0.25 mg via INTRAVENOUS

## 2024-02-18 MED ORDER — ONDANSETRON HCL 4 MG PO TABS: 4.0000 mg | ORAL_TABLET | Freq: Four times a day (QID) | ORAL | Status: DC | PRN

## 2024-02-18 MED ORDER — DOCUSATE SODIUM 100 MG PO CAPS
100.0000 mg | ORAL_CAPSULE | Freq: Two times a day (BID) | ORAL | Status: DC
  Administered 2024-02-18 – 2024-02-19 (×3): 100 mg via ORAL
  Filled 2024-02-18 (×3): qty 1

## 2024-02-18 MED ORDER — ACETAMINOPHEN 325 MG PO TABS
325.0000 mg | ORAL_TABLET | Freq: Four times a day (QID) | ORAL | Status: DC | PRN
  Administered 2024-02-19: 650 mg via ORAL
  Filled 2024-02-18: qty 2

## 2024-02-18 MED ORDER — METHOCARBAMOL 500 MG PO TABS
500.0000 mg | ORAL_TABLET | Freq: Four times a day (QID) | ORAL | Status: DC | PRN
  Administered 2024-02-19: 500 mg via ORAL
  Filled 2024-02-18: qty 1

## 2024-02-18 MED ORDER — ONDANSETRON HCL 4 MG/2ML IJ SOLN
INTRAMUSCULAR | Status: DC | PRN
  Administered 2024-02-18: 4 mg via INTRAVENOUS

## 2024-02-18 MED ORDER — DEXAMETHASONE SODIUM PHOSPHATE 10 MG/ML IJ SOLN
INTRAMUSCULAR | Status: AC
  Filled 2024-02-18: qty 1

## 2024-02-18 MED ORDER — LACTATED RINGERS IV SOLN: INTRAVENOUS | Status: DC

## 2024-02-18 MED ORDER — HYDROCODONE-ACETAMINOPHEN 5-325 MG PO TABS
1.0000 | ORAL_TABLET | ORAL | Status: DC | PRN
  Administered 2024-02-18 (×3): 2 via ORAL
  Filled 2024-02-18 (×3): qty 2

## 2024-02-18 MED ORDER — FENTANYL CITRATE (PF) 100 MCG/2ML IJ SOLN
INTRAMUSCULAR | Status: AC
Start: 2024-02-18 — End: ?
  Filled 2024-02-18: qty 2

## 2024-02-18 MED ORDER — METOCLOPRAMIDE HCL 5 MG PO TABS: 5.0000 mg | ORAL_TABLET | Freq: Three times a day (TID) | ORAL | Status: DC | PRN

## 2024-02-18 MED ORDER — DEXAMETHASONE SODIUM PHOSPHATE 10 MG/ML IJ SOLN
INTRAMUSCULAR | Status: DC | PRN
  Administered 2024-02-18: 4 mg via INTRAVENOUS

## 2024-02-18 MED ORDER — ORAL CARE MOUTH RINSE: 15.0000 mL | Freq: Once | OROMUCOSAL | Status: AC

## 2024-02-18 MED ORDER — PROPOFOL 10 MG/ML IV BOLUS
INTRAVENOUS | Status: AC
  Filled 2024-02-18: qty 20

## 2024-02-18 MED ORDER — PROPOFOL 10 MG/ML IV BOLUS
INTRAVENOUS | Status: DC | PRN
  Administered 2024-02-18: 20 mg via INTRAVENOUS

## 2024-02-18 MED ORDER — EPHEDRINE 5 MG/ML INJ
INTRAVENOUS | Status: AC
  Filled 2024-02-18: qty 5

## 2024-02-18 MED ORDER — 0.9 % SODIUM CHLORIDE (POUR BTL) OPTIME
TOPICAL | Status: DC | PRN
  Administered 2024-02-18: 1000 mL

## 2024-02-18 MED ORDER — CEFAZOLIN SODIUM-DEXTROSE 2-4 GM/100ML-% IV SOLN
2.0000 g | INTRAVENOUS | Status: AC
  Administered 2024-02-18: 2 g via INTRAVENOUS
  Filled 2024-02-18: qty 100

## 2024-02-18 MED ORDER — CEFAZOLIN SODIUM-DEXTROSE 2-4 GM/100ML-% IV SOLN
2.0000 g | Freq: Four times a day (QID) | INTRAVENOUS | Status: AC
  Administered 2024-02-18 (×2): 2 g via INTRAVENOUS
  Filled 2024-02-18 (×2): qty 100

## 2024-02-18 MED ORDER — LISINOPRIL 20 MG PO TABS
20.0000 mg | ORAL_TABLET | Freq: Every day | ORAL | Status: DC
  Administered 2024-02-18 – 2024-02-19 (×2): 20 mg via ORAL
  Filled 2024-02-18 (×2): qty 1

## 2024-02-18 MED ORDER — POVIDONE-IODINE 10 % EX SWAB: 2.0000 | Freq: Once | CUTANEOUS | Status: DC

## 2024-02-18 MED ORDER — PROPOFOL 500 MG/50ML IV EMUL
INTRAVENOUS | Status: DC | PRN
  Administered 2024-02-18: 65 ug/kg/min via INTRAVENOUS

## 2024-02-18 MED ORDER — SODIUM CHLORIDE 0.9 % IR SOLN
Status: DC | PRN
  Administered 2024-02-18: 1000 mL

## 2024-02-18 MED ORDER — EPHEDRINE SULFATE-NACL 50-0.9 MG/10ML-% IV SOSY
PREFILLED_SYRINGE | INTRAVENOUS | Status: DC | PRN
  Administered 2024-02-18 (×4): 5 mg via INTRAVENOUS

## 2024-02-18 MED ORDER — HYDROMORPHONE HCL 1 MG/ML IJ SOLN
INTRAMUSCULAR | Status: AC
  Filled 2024-02-18: qty 1

## 2024-02-18 MED ORDER — ACETAMINOPHEN 500 MG PO TABS
1000.0000 mg | ORAL_TABLET | Freq: Once | ORAL | Status: DC
  Filled 2024-02-18: qty 2

## 2024-02-18 MED ORDER — METOCLOPRAMIDE HCL 5 MG/ML IJ SOLN: 5.0000 mg | Freq: Three times a day (TID) | INTRAMUSCULAR | Status: DC | PRN

## 2024-02-18 MED ORDER — FENTANYL CITRATE (PF) 100 MCG/2ML IJ SOLN
INTRAMUSCULAR | Status: DC | PRN
  Administered 2024-02-18: 50 ug via INTRAVENOUS

## 2024-02-18 MED ORDER — DROPERIDOL 2.5 MG/ML IJ SOLN
INTRAMUSCULAR | Status: AC
  Filled 2024-02-18: qty 2

## 2024-02-18 MED ORDER — HYDROCODONE-ACETAMINOPHEN 7.5-325 MG PO TABS
1.0000 | ORAL_TABLET | ORAL | Status: DC | PRN
  Administered 2024-02-19 (×3): 2 via ORAL
  Filled 2024-02-18 (×3): qty 2

## 2024-02-18 MED ORDER — LIDOCAINE HCL (PF) 2 % IJ SOLN
INTRAMUSCULAR | Status: DC | PRN
  Administered 2024-02-18: 40 mg via INTRADERMAL

## 2024-02-18 MED ORDER — DROPERIDOL 2.5 MG/ML IJ SOLN
0.6250 mg | Freq: Once | INTRAMUSCULAR | Status: AC | PRN
  Administered 2024-02-18: 0.625 mg via INTRAVENOUS

## 2024-02-18 MED ORDER — SODIUM CHLORIDE 0.9 % IV SOLN: INTRAVENOUS | Status: DC

## 2024-02-18 MED ORDER — MORPHINE SULFATE (PF) 2 MG/ML IV SOLN: 0.5000 mg | INTRAVENOUS | Status: DC | PRN

## 2024-02-18 MED ORDER — METOPROLOL SUCCINATE ER 25 MG PO TB24
25.0000 mg | ORAL_TABLET | Freq: Every day | ORAL | Status: DC
  Administered 2024-02-19: 25 mg via ORAL
  Filled 2024-02-18: qty 1

## 2024-02-18 SURGICAL SUPPLY — 38 items
ARTICULEZE HEAD (Hips) ×1 IMPLANT
BAG COUNTER SPONGE SURGICOUNT (BAG) ×1 IMPLANT
BAG ZIPLOCK 12X15 (MISCELLANEOUS) IMPLANT
BENZOIN TINCTURE PRP APPL 2/3 (GAUZE/BANDAGES/DRESSINGS) IMPLANT
BLADE SAW SGTL 18X1.27X75 (BLADE) ×1 IMPLANT
COVER PERINEAL POST (MISCELLANEOUS) ×1 IMPLANT
COVER SURGICAL LIGHT HANDLE (MISCELLANEOUS) ×1 IMPLANT
DRAPE FOOT SWITCH (DRAPES) ×1 IMPLANT
DRAPE STERI IOBAN 125X83 (DRAPES) ×1 IMPLANT
DRAPE U-SHAPE 47X51 STRL (DRAPES) ×2 IMPLANT
DRSG AQUACEL AG ADV 3.5X10 (GAUZE/BANDAGES/DRESSINGS) ×1 IMPLANT
DURAPREP 26ML APPLICATOR (WOUND CARE) ×1 IMPLANT
ELECT PENCIL ROCKER SW 15FT (MISCELLANEOUS) ×1 IMPLANT
ELECT REM PT RETURN 15FT ADLT (MISCELLANEOUS) ×1 IMPLANT
GAUZE XEROFORM 1X8 LF (GAUZE/BANDAGES/DRESSINGS) IMPLANT
GLOVE BIO SURGEON STRL SZ7.5 (GLOVE) ×1 IMPLANT
GLOVE BIOGEL PI IND STRL 8 (GLOVE) ×2 IMPLANT
GLOVE ECLIPSE 8.0 STRL XLNG CF (GLOVE) ×1 IMPLANT
GOWN STRL REUS W/ TWL XL LVL3 (GOWN DISPOSABLE) ×2 IMPLANT
HEAD ARTICULEZE (Hips) IMPLANT
HOLDER FOLEY CATH W/STRAP (MISCELLANEOUS) ×1 IMPLANT
KIT TURNOVER KIT A (KITS) IMPLANT
LINER ACETAB NEUTRAL 36ID 520D (Liner) IMPLANT
PACK ANTERIOR HIP CUSTOM (KITS) ×1 IMPLANT
PIN SECTOR W/GRIP ACE CUP 52MM (Hips) IMPLANT
SET HNDPC FAN SPRY TIP SCT (DISPOSABLE) ×1 IMPLANT
STAPLER SKIN PROX WIDE 3.9 (STAPLE) IMPLANT
STEM FEM SZ3 STD ACTIS (Stem) IMPLANT
STRIP CLOSURE SKIN 1/2X4 (GAUZE/BANDAGES/DRESSINGS) IMPLANT
SUT ETHIBOND NAB CT1 #1 30IN (SUTURE) ×1 IMPLANT
SUT ETHILON 2 0 PS N (SUTURE) IMPLANT
SUT MNCRL AB 4-0 PS2 18 (SUTURE) IMPLANT
SUT VIC AB 0 CT1 36 (SUTURE) ×1 IMPLANT
SUT VIC AB 1 CT1 36 (SUTURE) ×1 IMPLANT
SUT VIC AB 2-0 CT1 TAPERPNT 27 (SUTURE) ×2 IMPLANT
TRAY FOLEY MTR SLVR 14FR STAT (SET/KITS/TRAYS/PACK) IMPLANT
TRAY FOLEY MTR SLVR 16FR STAT (SET/KITS/TRAYS/PACK) IMPLANT
YANKAUER SUCT BULB TIP NO VENT (SUCTIONS) ×1 IMPLANT

## 2024-02-18 NOTE — Evaluation (Signed)
 Physical Therapy Evaluation Patient Details Name: Charlene Norris MRN: [id number] DOB: 10-12-1941 Today's Date: 02/18/2024  History of Present Illness  Pt s/p R THR and with hx of PAf and bil TKR  Clinical Impression  Pt s/p R THR and presents with decreased R LE strength/ROM and post op pain limiting functional mobility.  Pt should progress well to dc home with family assist.        If plan is discharge home, recommend the following: A little help with walking and/or transfers;A little help with bathing/dressing/bathroom;Assistance with cooking/housework;Assist for transportation;Help with stairs or ramp for entrance   Can travel by private vehicle        Equipment Recommendations None recommended by PT  Recommendations for Other Services       Functional Status Assessment Patient has had a recent decline in their functional status and demonstrates the ability to make significant improvements in function in a reasonable and predictable amount of time.     Precautions / Restrictions Precautions Precautions: Fall Restrictions Weight Bearing Restrictions Per Provider Order: No LLE Weight Bearing Per Provider Order: Weight bearing as tolerated      Mobility  Bed Mobility Overal bed mobility: Needs Assistance Bed Mobility: Supine to Sit     Supine to sit: Min assist     General bed mobility comments: Cues for sequence and use of L LE to self assist    Transfers Overall transfer level: Needs assistance Equipment used: Rolling walker (2 wheels) Transfers: Sit to/from Stand Sit to Stand: Min assist           General transfer comment: cues for LE management and use of UEs to self assist    Ambulation/Gait Ambulation/Gait assistance: Min assist Gait Distance (Feet): 48 Feet Assistive device: Rolling walker (2 wheels) Gait Pattern/deviations: Step-to pattern, Decreased step length - right, Decreased step length - left, Shuffle, Trunk flexed Gait velocity: decr      General Gait Details: cues for sequence, posture and position from [employer]            Wheelchair Mobility     Tilt Bed    Modified Rankin (Stroke Patients Only)       Balance Overall balance assessment: Needs assistance Sitting-balance support: Feet supported Sitting balance-Leahy Scale: Good     Standing balance support: Bilateral upper extremity supported Standing balance-Leahy Scale: Poor                               Pertinent Vitals/Pain Pain Assessment Pain Assessment: 0-10 Pain Score: 5  Pain Location: R hip Pain Descriptors / Indicators: Aching, Sore Pain Intervention(s): Limited activity within patient's tolerance, Monitored during session, Premedicated before session, Ice applied    Home Living Family/patient expects to be discharged to:: Private residence Living Arrangements: Spouse/significant other Available Help at Discharge: Family;Available 24 hours/day Type of Home: House Home Access: Stairs to enter Entrance Stairs-Rails: Right Entrance Stairs-Number of Steps: 3   Home Layout: One level Home Equipment: Agricultural consultant (2 wheels);Cane - quad;Cane - single point;Crutches;Toilet riser      Prior Function Prior Level of Function : Independent/Modified Independent             Mobility Comments: using RW 2* hip pain       Extremity/Trunk Assessment   Upper Extremity Assessment Upper Extremity Assessment: Overall WFL for tasks assessed    Lower Extremity Assessment Lower Extremity Assessment: RLE deficits/detail  Communication   Communication Communication: No apparent difficulties    Cognition Arousal: Alert Behavior During Therapy: WFL for tasks assessed/performed   PT - Cognitive impairments: No apparent impairments                         Following commands: Intact       Cueing Cueing Techniques: Verbal cues     General Comments      Exercises Total Joint Exercises Ankle  Circles/Pumps: AROM, Both, 15 reps, Supine   Assessment/Plan    PT Assessment Patient needs continued PT services  PT Problem List Decreased strength;Decreased range of motion;Decreased activity tolerance;Decreased balance;Decreased mobility;Decreased knowledge of use of DME;Pain       PT Treatment Interventions DME instruction;Stair training;Gait training;Functional mobility training;Therapeutic activities;Therapeutic exercise;Patient/family education    PT Goals (Current goals can be found in the Care Plan section)  Acute Rehab PT Goals Patient Stated Goal: Regain IND and walk with less pain PT Goal Formulation: With patient Time For Goal Achievement: 02/25/24 Potential to Achieve Goals: Good    Frequency 7X/week     Co-evaluation               AM-PAC PT "6 Clicks" Mobility  Outcome Measure Help needed turning from your back to your side while in a flat bed without using bedrails?: A Little Help needed moving from lying on your back to sitting on the side of a flat bed without using bedrails?: A Little Help needed moving to and from a bed to a chair (including a wheelchair)?: A Little Help needed standing up from a chair using your arms (e.g., wheelchair or bedside chair)?: A Little Help needed to walk in hospital room?: A Little Help needed climbing 3-5 steps with a railing? : A Lot 6 Click Score: 17    End of Session Equipment Utilized During Treatment: Gait belt Activity Tolerance: Patient tolerated treatment well Patient left: in chair;with call bell/phone within reach;with chair alarm set;with family/visitor present Nurse Communication: Mobility status PT Visit Diagnosis: Difficulty in walking, not elsewhere classified (R26.2)    Time: 9604-5409 PT Time Calculation (min) (ACUTE ONLY): 23 min   Charges:   PT Evaluation $PT Eval Low Complexity: 1 Low PT Treatments $Gait Training: 8-22 mins PT General Charges $$ ACUTE PT VISIT: 1 Visit         Mauro Kaufmann PT Acute Rehabilitation Services Pager [phone] Office [phone]   Charlene Norris 02/18/2024, 4:15 PM

## 2024-02-18 NOTE — Anesthesia Procedure Notes (Addendum)
 Procedure Name: MAC Date/Time: 02/18/2024 8:50 AM  Performed by: Sindy Guadeloupe, CRNAPre-anesthesia Checklist: Patient identified, Emergency Drugs available, Suction available, Patient being monitored and Timeout performed Oxygen Delivery Method: Simple face mask Placement Confirmation: positive ETCO2

## 2024-02-18 NOTE — Op Note (Signed)
 Operative Note  Date of operation: 02/18/2024 Preoperative diagnosis: Right hip primary osteoarthritis Postoperative diagnosis: Same  Procedure: Right direct anterior total hip arthroplasty  Implants: Implant Name Type Inv. Item Serial No. Manufacturer Lot No. LRB No. Used Action  PIN SECTOR W/GRIP ACE CUP - [id number] Hips PIN SECTOR W/GRIP ACE CUP  [employer]PU[employer] [id number] Right 1 Implanted  LINER ACETAB NEUTRAL 36ID 520D - [id number] Liner LINER ACETAB NEUTRAL 36ID 520D  DEPU[employer]TH[employer] [id number] Right 1 Implanted  STEM FEM SZ3 STD ACTIS - [id number] Stem STEM FEM SZ3 STD ACTIS  [employer]PUY ORTHOPA[employer]ICS [id number] Right 1 Implanted  ARTICULEZE HEAD - [id number] Hips ARTICULEZE HEAD  DEPUY ORTH[employer] [id number] Right 1 Implanted   Surgeon: Vanita Panda. Magnus Ivan, MD Assistant: Rexene Edison, PA-C  Anesthesia: Spinal EBL: 50 cc Antibiotics: IV Ancef Complications: None  Indications: The patient is an 83 year old active female with debilitating arthritis involving her right hip this been well-documented with clinical exam and x-ray findings.  She has tried and failed conservative treatment for over a year now.  At this point her right hip pain is daily and it is detrimentally affecting her mobility, her quality of life and her actives daily living to the point she does wish to proceed with a hip replacement and we agree with this as well based on her clinical exam and x-ray findings combined with her daily pain and severity of her right hip arthritis.  We did discuss the risks of acute blood loss anemia, nerve or vessel injury, fracture, infection, dislocation, DVT, implant failure, leg length differences and wound healing issues.  She understands that our goals are hopefully decreased pain, improved mobility and improved quality of life.  Procedure description: After informed consent was obtained and the appropriate right hip was marked, the patient was brought to the operating room  and set up on the stretcher where spinal anesthesia was obtained.  She was then laid in supine position on stretcher and a Foley catheter is placed.  Traction boots were next placed on both her feet and she was then placed supine on the Hana fracture table with both legs in inline skeletal traction devices but no traction applied.  Her right operative hip and pelvis were assessed radiographically.  The right hip was then prepped and draped with DuraPrep and sterile drapes.  Timeout is called and she has been about scrapers and correct right hip.  Incision was then made just inferior and posterior to the ASIS and carried slightly obliquely down the leg.  Dissection was carried down to the tensor fascia lata muscle and the tensor fascia was then divided longitudinally to proceed with a direct anterior approach to the hip.  Circumflex vessels were identified cauterized.  The hip capsule was identified and opened up in L-type format.  Cobra retractors were placed around the medial and lateral femoral neck and a femoral neck cut was made with oscillating saw just proximal to the lesser trochanter.  This cut was completed with an osteotome.  A corkscrew guide was placed in the femoral head and the femoral head was removed in its entirety and there was a wide area devoid of cartilage.  A bent Hohmann was then placed over the medial acetabular rim and remnants of the acetabular labrum and other debris removed.  Reaming was initiated from a size 43 reamer and stepwise increments going up to a size 51 reamer with all reamers placed under direct visualization and the last reamer placed under direct fluoroscopy in order  to obtain the depth of reaming, the inclination and the anteversion.  The real DePuy sector GRIPTION acetabular clinic size 52 was then placed without difficulty followed by a 36+0 polythene liner.  Attention was then turned the femur.  With the right leg externally rotated to 120 degrees, extended and adducted,  a Mueller retractors placed medially and a Hohmann retractor behind the greater trochanter.  The lateral joint capsule was released and a box cutting osteotome was used in her femoral canal.  Broaching was then initiated using the Actis broaching system from a size 0 going up to a size 3.  With a size 3 in place we trialed a standard offset femoral neck and a 36+1.5 trial head ball.  The right leg was brought over and up and with traction and internal rotation reduced in the pelvis.  Based on radiographic and clinical assessment we felt like we needed more leg length.  Everything else seems stable.  Would like the positioning of the implants.  The hip was then dislocated and we removed all trial components.  We then placed the real Actis femoral component with standard offset size 3 and the real 36+5 metal head ball.  Again this was reduced and acetabulum and we are pleased the range of motion and offset as well as leg length and stability assessed both radiographically and mechanically.  The soft tissue was then irrigated with normal saline solution.  The joint capsule was closed with interrupted #1 Ethibond suture followed by normal and regular goes the tensor fascia.  0 Vicryl was used to close the deep tissue and 2-0 Vicryl was used to close subcu tissue.  The skin was closed with staples.  Well-padded sterile dressing was applied.  She was taken to the recovery room in stable addition.  Rexene Edison, PA-C did assist during the entire case his assistance was medically necessary and clinically relevant for soft tissue management and retraction, helping guide implant placement and a layered closure of the wound.

## 2024-02-18 NOTE — TOC Transition Note (Signed)
 Transition of Care [facility]) - Discharge Note   Patient Details  Name: Charlene Norris MRN: [id number] Date of Birth: 1940-12-07  [facility] CM/SW Contact:  Amada Jupiter, LCSW Phone Number: 02/18/2024, 2:38 PM   Clinical Narrative:     Met with pt and spouse who confirm pt has needed DME in the home.  HHPT prearranged with [facility] via ortho MD office prior to surgery.  No further TOC needs.    Barriers to Discharge: No Barriers Identified   Patient Goals and CMS Choice Patient states their goals for this hospitalization and ongoing recovery are:: return home          Discharge Placement                       Discharge Plan and Services Additional resources added to the After Visit Summary for                  DME Arranged: N/A DME Agency: NA       HH Arranged: PT HH Agency: [facility]        Social Drivers of Health (SDOH) Interventions SDOH Screenings   Food Insecurity: No Food Insecurity (02/18/2024)  Housing: Low Risk  (02/18/2024)  Transportation Needs: No Transportation Needs (02/18/2024)  Utilities: Not At Risk (02/18/2024)  Depression (PHQ2-9): High Risk (12/06/2023)  Financial Resource Strain: Medium Risk (12/02/2023)  Physical Activity: Unknown (12/02/2023)  Social Connections: Socially Integrated (02/18/2024)  Stress: Stress Concern Present (12/02/2023)  Tobacco Use: Medium Risk (02/18/2024)     Readmission Risk Interventions     No data to display

## 2024-02-18 NOTE — Interval H&P Note (Signed)
 History and Physical Interval Note: The patient understands that she is here today for right total hip replacement to treat her significant right hip pain and arthritis.  There has been no acute or interval change in her medical status.  The risks and benefits of surgery have been discussed in detail and informed consent has been obtained.  The right operative hip has been marked.  02/18/2024 6:56 AM  Charlene Norris  has presented today for surgery, with the diagnosis of osteoarthritis right hip.  The various methods of treatment have been discussed with the patient and family. After consideration of risks, benefits and other options for treatment, the patient has consented to  Procedure(s): ARTHROPLASTY, HIP, TOTAL, ANTERIOR APPROACH (Right) as a surgical intervention.  The patient's history has been reviewed, patient examined, no change in status, stable for surgery.  I have reviewed the patient's chart and labs.  Questions were answered to the patient's satisfaction.     Kathryne Hitch

## 2024-02-18 NOTE — Anesthesia Procedure Notes (Signed)
 Spinal  Patient location during procedure: OR Start time: 02/18/2024 8:18 AM Reason for block: surgical anesthesia Staffing Performed: resident/CRNA  Anesthesiologist: Lewie Loron, MD Resident/CRNA: Sindy Guadeloupe, CRNA Performed by: Sindy Guadeloupe, CRNA Authorized by: Lewie Loron, MD   Preanesthetic Checklist Completed: patient identified, IV checked, site marked, risks and benefits discussed, surgical consent, monitors and equipment checked, pre-op evaluation and timeout performed Spinal Block Patient position: sitting Prep: DuraPrep and site prepped and draped Patient monitoring: heart rate, cardiac monitor, continuous pulse ox and blood pressure Approach: midline Location: L3-4 Injection technique: single-shot Needle Needle type: Pencan  Needle gauge: 24 G Needle length: 10 cm Assessment Events: CSF return Additional Notes Pt placed in sitting position, spinal kit expiration date checked and verified, timeout, + CSF, - heme, pt tolerated well. Dr Renold Don present and supervising throughout SAB placement. Adequate sensory level.

## 2024-02-18 NOTE — Anesthesia Postprocedure Evaluation (Signed)
 Anesthesia Post Note  Patient: Charlene Norris  Procedure(s) Performed: ARTHROPLASTY, HIP, TOTAL, ANTERIOR APPROACH (Right: Hip)     Patient location during evaluation: PACU Anesthesia Type: Spinal Level of consciousness: sedated and patient cooperative Pain management: pain level controlled Vital Signs Assessment: post-procedure vital signs reviewed and stable Respiratory status: spontaneous breathing Cardiovascular status: stable Anesthetic complications: no   No notable events documented.  Last Vitals:  Vitals:   02/18/24 1515 02/18/24 1721  BP: (!) 139/59 (!) 159/56  Pulse: 63 67  Resp: 12 18  Temp: (!) 36.1 C 37 C  SpO2: 97% 98%    Last Pain:  Vitals:   02/18/24 1721  TempSrc: Oral  PainSc:                  Lewie Loron

## 2024-02-18 NOTE — Transfer of Care (Signed)
 Immediate Anesthesia Transfer of Care Note  Patient: Charlene Norris  Procedure(s) Performed: ARTHROPLASTY, HIP, TOTAL, ANTERIOR APPROACH (Right: Hip)  Patient Location: PACU  Anesthesia Type:Spinal  Level of Consciousness: awake, alert , and patient cooperative  Airway & Oxygen Therapy: Patient Spontanous Breathing and Patient connected to face mask oxygen  Post-op Assessment: Report given to RN and Post -op Vital signs reviewed and stable  Post vital signs: Reviewed and stable  Last Vitals:  Vitals Value Taken Time  BP 134/59 02/18/24 1025  Temp    Pulse 68 02/18/24 1028  Resp 15 02/18/24 1028  SpO2 100 % 02/18/24 1028  Vitals shown include unfiled device data.  Last Pain:  Vitals:   02/18/24 0618  TempSrc: Oral         Complications: No notable events documented.

## 2024-02-19 DIAGNOSIS — I10 Essential (primary) hypertension: Secondary | ICD-10-CM | POA: Diagnosis not present

## 2024-02-19 DIAGNOSIS — I4891 Unspecified atrial fibrillation: Secondary | ICD-10-CM | POA: Diagnosis not present

## 2024-02-19 DIAGNOSIS — E119 Type 2 diabetes mellitus without complications: Secondary | ICD-10-CM | POA: Diagnosis not present

## 2024-02-19 DIAGNOSIS — M1611 Unilateral primary osteoarthritis, right hip: Secondary | ICD-10-CM | POA: Diagnosis not present

## 2024-02-19 DIAGNOSIS — Z7901 Long term (current) use of anticoagulants: Secondary | ICD-10-CM | POA: Diagnosis not present

## 2024-02-19 DIAGNOSIS — I480 Paroxysmal atrial fibrillation: Secondary | ICD-10-CM | POA: Diagnosis not present

## 2024-02-19 DIAGNOSIS — Z79899 Other long term (current) drug therapy: Secondary | ICD-10-CM | POA: Diagnosis not present

## 2024-02-19 DIAGNOSIS — Z96653 Presence of artificial knee joint, bilateral: Secondary | ICD-10-CM | POA: Diagnosis not present

## 2024-02-19 DIAGNOSIS — Z87891 Personal history of nicotine dependence: Secondary | ICD-10-CM | POA: Diagnosis not present

## 2024-02-19 LAB — CBC
HCT: 37.2 % (ref 36.0–46.0)
Hemoglobin: 11.8 g/dL — ABNORMAL LOW (ref 12.0–15.0)
MCH: 27.8 pg (ref 26.0–34.0)
MCHC: 31.7 g/dL (ref 30.0–36.0)
MCV: 87.7 fL (ref 80.0–100.0)
Platelets: 155 10*3/uL (ref 150–400)
RBC: 4.24 MIL/uL (ref 3.87–5.11)
RDW: 13.1 % (ref 11.5–15.5)
WBC: 10.7 10*3/uL — ABNORMAL HIGH (ref 4.0–10.5)
nRBC: 0 % (ref 0.0–0.2)

## 2024-02-19 LAB — BASIC METABOLIC PANEL WITH GFR
Anion gap: 7 (ref 5–15)
BUN: 14 mg/dL (ref 8–23)
CO2: 26 mmol/L (ref 22–32)
Calcium: 8.5 mg/dL — ABNORMAL LOW (ref 8.9–10.3)
Chloride: 105 mmol/L (ref 98–111)
Creatinine, Ser: 0.66 mg/dL (ref 0.44–1.00)
GFR, Estimated: >60 mL/min (ref >60)
Glucose, Bld: 123 mg/dL — ABNORMAL HIGH (ref 70–99)
Potassium: 4.2 mmol/L (ref 3.5–5.1)
Sodium: 138 mmol/L (ref 135–145)

## 2024-02-19 MED ORDER — HYDROCODONE-ACETAMINOPHEN 5-325 MG PO TABS: 1.0000 | ORAL_TABLET | Freq: Four times a day (QID) | ORAL | 0 refills | Status: DC | PRN

## 2024-02-19 MED ORDER — METHOCARBAMOL 500 MG PO TABS: 500.0000 mg | ORAL_TABLET | Freq: Four times a day (QID) | ORAL | 1 refills | Status: DC | PRN

## 2024-02-19 NOTE — Care Management Obs Status (Signed)
 MEDICARE OBSERVATION STATUS NOTIFICATION   Patient Details  Name: Charlene Norris MRN: [id number] Date of Birth: [birth year]   Medicare Observation Status Notification Given:  Birda Buffy, LCSW 02/19/2024, 10:53 AM

## 2024-02-19 NOTE — Progress Notes (Signed)
 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  Spouse present for session.  Pt eager for return home this date.    If plan is discharge home, recommend the following: A little help with walking and/or transfers;A little help with bathing/dressing/bathroom;Assistance with cooking/housework;Assist for transportation;Help with stairs or ramp for entrance   Can travel by private vehicle        Equipment Recommendations  None recommended by PT    Recommendations for Other Services       Precautions / Restrictions Precautions Precautions: Fall Restrictions Weight Bearing Restrictions Per Provider Order: No LLE Weight Bearing Per Provider Order: Weight bearing as tolerated     Mobility  Bed Mobility Overal bed mobility: Needs Assistance Bed Mobility: Supine to Sit     Supine to sit: Supervision     General bed mobility comments: Pt up in chair and returns to same    Transfers Overall transfer level: Needs assistance Equipment used: Rolling walker (2 wheels) Transfers: Sit to/from Stand Sit to Stand: Contact guard assist, Supervision           General transfer comment: cues for LE management and use of UEs to self assist    Ambulation/Gait Ambulation/Gait assistance: Contact guard assist, Supervision Gait Distance (Feet): 100 Feet Assistive device: Rolling walker (2 wheels) Gait Pattern/deviations: Step-to pattern, Decreased step length - right, Decreased step length - left, Shuffle, Trunk flexed Gait velocity: decr     General Gait Details: min cues for sequence, posture and position from RW   Stairs Stairs: Yes Stairs assistance: Min assist Stair  Management: One rail Right, Step to pattern, Forwards, With cane Number of Stairs: 5 General stair comments: 2+3 stairs with rail and QC; cues for sequence and foot/QC placement   Wheelchair Mobility     Tilt Bed    Modified Rankin (Stroke Patients Only)       Balance Overall balance assessment: Needs assistance Sitting-balance support: Feet supported Sitting balance-Leahy Scale: Good     Standing balance support: Single extremity supported Standing balance-Leahy Scale: Poor                              Communication Communication Communication: No apparent difficulties  Cognition Arousal: Alert Behavior During Therapy: WFL for tasks assessed/performed   PT - Cognitive impairments: No apparent impairments                         Following commands: Intact      Cueing Cueing Techniques: Verbal cues  Exercises Total Joint Exercises Ankle Circles/Pumps: AROM, Both, 15 reps, Supine Quad Sets: AROM, Both, 10 reps, Supine Heel Slides: AAROM, Right, 20 reps, Supine Hip ABduction/ADduction: AAROM, Right, 15 reps, Supine    General Comments        Pertinent Vitals/Pain Pain Assessment Pain Assessment: 0-10 Pain Score: 4  Pain Location: R hip Pain Descriptors / Indicators: Aching, Sore Pain Intervention(s): Limited activity within patient's tolerance, Monitored during session, Premedicated before session, Ice applied    Home Living  Prior Function            PT Goals (current goals can now be found in the care plan section) Acute Rehab PT Goals Patient Stated Goal: Regain IND and walk with less pain PT Goal Formulation: With patient Time For Goal Achievement: 02/25/24 Potential to Achieve Goals: Good Progress towards PT goals: Progressing toward goals    Frequency    7X/week      PT Plan      Co-evaluation              AM-PAC PT "6 Clicks" Mobility   Outcome Measure  Help needed  turning from your back to your side while in a flat bed without using bedrails?: A Little Help needed moving from lying on your back to sitting on the side of a flat bed without using bedrails?: A Little Help needed moving to and from a bed to a chair (including a wheelchair)?: A Little Help needed standing up from a chair using your arms (e.g., wheelchair or bedside chair)?: A Little Help needed to walk in hospital room?: A Little Help needed climbing 3-5 steps with a railing? : A Little 6 Click Score: 18    End of Session Equipment Utilized During Treatment: Gait belt Activity Tolerance: Patient tolerated treatment well Patient left: in chair;with call bell/phone within reach;with chair alarm set Nurse Communication: Mobility status PT Visit Diagnosis: Difficulty in walking, not elsewhere classified (R26.2)     Time: 5284-1324 PT Time Calculation (min) (ACUTE ONLY): 29 min  Charges:    $Gait Training: 8-22 mins $Therapeutic Exercise: 8-22 mins $Therapeutic Activity: 8-22 mins PT General Charges $$ ACUTE PT VISIT: 1 Visit                     Thedora Finlay PT Acute Rehabilitation Services Pager [phone] Office [phone]    Lyanna Blystone 02/19/2024, 1:03 PM

## 2024-02-19 NOTE — Plan of Care (Signed)
  Problem: Education: Goal: Knowledge of General Education information will improve Description: Including pain rating scale, medication(s)/side effects and non-pharmacologic comfort measures Outcome: Progressing   Problem: Health Behavior/Discharge Planning: Goal: Ability to manage health-related needs will improve Outcome: Progressing   Problem: Clinical Measurements: Goal: Ability to maintain clinical measurements within normal limits will improve Outcome: Progressing Goal: Will remain free from infection Outcome: Progressing Goal: Diagnostic test results will improve Outcome: Progressing Goal: Respiratory complications will improve Outcome: Progressing Goal: Cardiovascular complication will be avoided Outcome: Progressing   Problem: Activity: Goal: Risk for activity intolerance will decrease Outcome: Progressing   Problem: Nutrition: Goal: Adequate nutrition will be maintained Outcome: Adequate for Discharge   Problem: Coping: Goal: Level of anxiety will decrease Outcome: Progressing   Problem: Elimination: Goal: Will not experience complications related to bowel motility Outcome: Progressing Goal: Will not experience complications related to urinary retention Outcome: Progressing   Problem: Pain Managment: Goal: General experience of comfort will improve and/or be controlled Outcome: Progressing   Problem: Safety: Goal: Ability to remain free from injury will improve Outcome: Progressing   Problem: Skin Integrity: Goal: Risk for impaired skin integrity will decrease Outcome: Progressing   Problem: Education: Goal: Knowledge of General Education information will improve Description: Including pain rating scale, medication(s)/side effects and non-pharmacologic comfort measures Outcome: Progressing   Problem: Health Behavior/Discharge Planning: Goal: Ability to manage health-related needs will improve Outcome: Progressing   Problem: Education: Goal:  Knowledge of the prescribed therapeutic regimen will improve Outcome: Progressing Goal: Understanding of discharge needs will improve Outcome: Progressing Goal: Individualized Educational Video(s) Outcome: Completed/Met   Problem: Activity: Goal: Ability to avoid complications of mobility impairment will improve Outcome: Progressing Goal: Ability to tolerate increased activity will improve Outcome: Adequate for Discharge   Problem: Clinical Measurements: Goal: Postoperative complications will be avoided or minimized Outcome: Progressing   Problem: Pain Management: Goal: Pain level will decrease with appropriate interventions Outcome: Adequate for Discharge   Problem: Skin Integrity: Goal: Will show signs of wound healing Outcome: Progressing   Problem: Education: Goal: Knowledge of the prescribed therapeutic regimen will improve Outcome: Progressing Goal: Understanding of discharge needs will improve Outcome: Progressing Goal: Individualized Educational Video(s) Outcome: Completed/Met   Problem: Activity: Goal: Ability to avoid complications of mobility impairment will improve Outcome: Progressing Goal: Ability to tolerate increased activity will improve Outcome: Progressing   Problem: Clinical Measurements: Goal: Postoperative complications will be avoided or minimized Outcome: Progressing

## 2024-02-19 NOTE — Progress Notes (Signed)
 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  Will follow up with family ed with spouse present     If plan is discharge home, recommend the following: A little help with walking and/or transfers;A little help with bathing/dressing/bathroom;Assistance with cooking/housework;Assist for transportation;Help with stairs or ramp for entrance   Can travel by private vehicle        Equipment Recommendations  None recommended by PT    Recommendations for Other Services       Precautions / Restrictions Precautions Precautions: Fall Restrictions Weight Bearing Restrictions Per Provider Order: No LLE Weight Bearing Per Provider Order: Weight bearing as tolerated     Mobility  Bed Mobility Overal bed mobility: Needs Assistance Bed Mobility: Supine to Sit     Supine to sit: Supervision     General bed mobility comments: Cues for sequence and use of L LE to self assist    Transfers Overall transfer level: Needs assistance Equipment used: Rolling walker (2 wheels) Transfers: Sit to/from Stand Sit to Stand: Contact guard assist           General transfer comment: cues for LE management and use of UEs to self assist    Ambulation/Gait Ambulation/Gait assistance: Min assist, Contact guard assist Gait Distance (Feet): 140 Feet Assistive device: Rolling walker (2 wheels) Gait Pattern/deviations: Step-to pattern, Decreased step length - right, Decreased step length - left, Shuffle, Trunk flexed Gait velocity: decr     General Gait Details: min cues for sequence, posture and position from [employer]             Wheelchair Mobility     Tilt Bed    Modified Rankin (Stroke Patients Only)       Balance Overall balance assessment: Needs  assistance Sitting-balance support: Feet supported Sitting balance-Leahy Scale: Good     Standing balance support: Single extremity supported Standing balance-Leahy Scale: Poor                              Communication Communication Communication: No apparent difficulties  Cognition Arousal: Alert Behavior During Therapy: WFL for tasks assessed/performed   PT - Cognitive impairments: No apparent impairments                         Following commands: Intact      Cueing Cueing Techniques: Verbal cues  Exercises Total Joint Exercises Ankle Circles/Pumps: AROM, Both, 15 reps, Supine Quad Sets: AROM, Both, 10 reps, Supine Heel Slides: AAROM, Right, 20 reps, Supine Hip ABduction/ADduction: AAROM, Right, 15 reps, Supine    General Comments        Pertinent Vitals/Pain Pain Assessment Pain Assessment: 0-10 Pain Score: 4  Pain Location: R hip Pain Descriptors / Indicators: Aching, Sore Pain Intervention(s): Limited activity within patient's tolerance, Monitored during session, Premedicated before session, Ice applied    Home Living                          Prior Function            PT Goals (current goals can now be found in the care plan section) Acute Rehab PT  Goals Patient Stated Goal: Regain IND and walk with less pain PT Goal Formulation: With patient Time For Goal Achievement: 02/25/24 Potential to Achieve Goals: Good Progress towards PT goals: Progressing toward goals    Frequency    7X/week      PT Plan      Co-evaluation              AM-PAC PT "6 Clicks" Mobility   Outcome Measure  Help needed turning from your back to your side while in a flat bed without using bedrails?: A Little Help needed moving from lying on your back to sitting on the side of a flat bed without using bedrails?: A Little Help needed moving to and from a bed to a chair (including a wheelchair)?: A Little Help needed standing up from  a chair using your arms (e.g., wheelchair or bedside chair)?: A Little Help needed to walk in hospital room?: A Little Help needed climbing 3-5 steps with a railing? : A Lot 6 Click Score: 17    End of Session Equipment Utilized During Treatment: Gait belt Activity Tolerance: Patient tolerated treatment well Patient left: in chair;with call bell/phone within reach;with chair alarm set Nurse Communication: Mobility status PT Visit Diagnosis: Difficulty in walking, not elsewhere classified (R26.2)     Time: 6295-2841 PT Time Calculation (min) (ACUTE ONLY): 34 min  Charges:    $Gait Training: 8-22 mins $Therapeutic Exercise: 8-22 mins PT General Charges $$ ACUTE PT VISIT: 1 Visit                     Thedora Finlay PT Acute Rehabilitation Services Pager [phone] Office [phone]    Marte Celani 02/19/2024, 12:58 PM

## 2024-02-19 NOTE — Progress Notes (Signed)
 Subjective: 1 Day Post-Op Procedure(s) (LRB): ARTHROPLASTY, HIP, TOTAL, ANTERIOR APPROACH (Right) Patient reports pain as mild.  Has worked well thus far with therapy.  Objective: Vital signs in last 24 hours: Temp:  [96.8 F (36 C)-98.6 F (37 C)] 97.7 F (36.5 C) (04/12 0541) Pulse Rate:  [53-67] 60 (04/12 0541) Resp:  [11-18] 17 (04/12 0541) BP: (121-192)/(46-80) 151/57 (04/12 0541) SpO2:  [97 %-100 %] 99 % (04/12 0541)  Intake/Output from previous day: 04/11 0701 - 04/12 0700 In: 3681.7 [P.O.:1600; I.V.:1881.7; IV Piggyback:200] Out: 1960 [Urine:1900; Blood:60] Intake/Output this shift: Total I/O In: -  Out: 200 [Urine:200]  Recent Labs    02/19/24 0335  HGB 11.8*   Recent Labs    02/19/24 0335  WBC 10.7*  RBC 4.24  HCT 37.2  PLT 155   Recent Labs    02/19/24 0335  NA 138  K 4.2  CL 105  CO2 26  BUN 14  CREATININE 0.66  GLUCOSE 123*  CALCIUM 8.5*   No results for input(s): "LABPT", "INR" in the last 72 hours.  Sensation intact distally Intact pulses distally Dorsiflexion/Plantar flexion intact   Assessment/Plan: 1 Day Post-Op Procedure(s) (LRB): ARTHROPLASTY, HIP, TOTAL, ANTERIOR APPROACH (Right) Up with therapy Discharge home with home health this afternoon.      Arnie Lao 02/19/2024, 11:03 AM

## 2024-02-19 NOTE — Discharge Instructions (Signed)
 Per [facility] clinic policy, our goal is ensure optimal postoperative pain control with a multimodal pain management strategy. For all [facility] patients, our goal is to wean post-operative narcotic medications by 6 weeks post-operatively. If this is not possible due to utilization of pain medication prior to surgery, your [facility] doctor will support your acute post-operative pain control for the first 6 weeks postoperatively, with a plan to transition you back to your primary pain team following that. Charlene Norris will work to ensure a Therapist, occupational.  INSTRUCTIONS AFTER JOINT REPLACEMENT   Remove items at home which could result in a fall. This includes throw rugs or furniture in walking pathways ICE to the affected joint every three hours while awake for 30 minutes at a time, for at least the first 3-5 days, and then as needed for pain and swelling.  Continue to use ice for pain and swelling. You may notice swelling that will progress down to the foot and ankle.  This is normal after surgery.  Elevate your leg when you are not up walking on it.   Continue to use the breathing machine you got in the hospital (incentive spirometer) which will help keep your temperature down.  It is common for your temperature to cycle up and down following surgery, especially at night when you are not up moving around and exerting yourself.  The breathing machine keeps your lungs expanded and your temperature down.   DIET:  As you were doing prior to hospitalization, we recommend a well-balanced diet.  DRESSING / WOUND CARE / SHOWERING  Keep the surgical dressing until follow up.  The dressing is water proof, so you can shower without any extra covering.  IF THE DRESSING FALLS OFF or the wound gets wet inside, change the dressing with sterile gauze.  Please use good hand washing techniques before changing the dressing.  Do not use any lotions or creams on the incision until instructed by your surgeon.     ACTIVITY  Increase activity slowly as tolerated, but follow the weight bearing instructions below.   No driving for 6 weeks or until further direction given by your physician.  You cannot drive while taking narcotics.  No lifting or carrying greater than 10 lbs. until further directed by your surgeon. Avoid periods of inactivity such as sitting longer than an hour when not asleep. This helps prevent blood clots.  You may return to work once you are authorized by your doctor.     WEIGHT BEARING   Weight bearing as tolerated with assist device (walker, cane, etc) as directed, use it as long as suggested by your surgeon or therapist, typically at least 4-6 weeks.   EXERCISES  Results after joint replacement surgery are often greatly improved when you follow the exercise, range of motion and muscle strengthening exercises prescribed by your doctor. Safety measures are also important to protect the joint from further injury. Any time any of these exercises cause you to have increased pain or swelling, decrease what you are doing until you are comfortable again and then slowly increase them. If you have problems or questions, call your caregiver or physical therapist for advice.   Rehabilitation is important following a joint replacement. After just a few days of immobilization, the muscles of the leg can become weakened and shrink (atrophy).  These exercises are designed to build up the tone and strength of the thigh and leg muscles and to improve motion. Often times heat used for twenty to thirty minutes before  working out will loosen up your tissues and help with improving the range of motion but do not use heat for the first two weeks following surgery (sometimes heat can increase post-operative swelling).   These exercises can be done on a training (exercise) mat, on the floor, on a table or on a bed. Use whatever works the best and is most comfortable for you.    Use music or television  while you are exercising so that the exercises are a pleasant break in your day. This will make your life better with the exercises acting as a break in your routine that you can look forward to.   Perform all exercises about fifteen times, three times per day or as directed.  You should exercise both the operative leg and the other leg as well.  Exercises include:   Quad Sets - Tighten up the muscle on the front of the thigh (Quad) and hold for 5-10 seconds.   Straight Leg Raises - With your knee straight (if you were given a brace, keep it on), lift the leg to 60 degrees, hold for 3 seconds, and slowly lower the leg.  Perform this exercise against resistance later as your leg gets stronger.  Leg Slides: Lying on your back, slowly slide your foot toward your buttocks, bending your knee up off the floor (only go as far as is comfortable). Then slowly slide your foot back down until your leg is flat on the floor again.  Angel Wings: Lying on your back spread your legs to the side as far apart as you can without causing discomfort.  Hamstring Strength:  Lying on your back, push your heel against the floor with your leg straight by tightening up the muscles of your buttocks.  Repeat, but this time bend your knee to a comfortable angle, and push your heel against the floor.  You may put a pillow under the heel to make it more comfortable if necessary.   A rehabilitation program following joint replacement surgery can speed recovery and prevent re-injury in the future due to weakened muscles. Contact your doctor or a physical therapist for more information on knee rehabilitation.    CONSTIPATION  Constipation is defined medically as fewer than three stools per week and severe constipation as less than one stool per week.  Even if you have a regular bowel pattern at home, your normal regimen is likely to be disrupted due to multiple reasons following surgery.  Combination of anesthesia, postoperative  narcotics, change in appetite and fluid intake all can affect your bowels.   YOU MUST use at least one of the following options; they are listed in order of increasing strength to get the job done.  They are all available over the counter, and you may need to use some, POSSIBLY even all of these options:    Drink plenty of fluids (prune juice may be helpful) and high fiber foods Colace 100 mg by mouth twice a day  Senokot for constipation as directed and as needed Dulcolax (bisacodyl), take with full glass of water  Miralax (polyethylene glycol) once or twice a day as needed.  If you have tried all these things and are unable to have a bowel movement in the first 3-4 days after surgery call either your surgeon or your primary doctor.    If you experience loose stools or diarrhea, hold the medications until you stool forms back up.  If your symptoms do not get better within 1 week  or if they get worse, check with your doctor.  If you experience "the worst abdominal pain ever" or develop nausea or vomiting, please contact the office immediately for further recommendations for treatment.   ITCHING:  If you experience itching with your medications, try taking only a single pain pill, or even half a pain pill at a time.  You can also use Benadryl over the counter for itching or also to help with sleep.   TED HOSE STOCKINGS:  Use stockings on both legs until for at least 2 weeks or as directed by physician office. They may be removed at night for sleeping.  MEDICATIONS:  See your medication summary on the "After Visit Summary" that nursing will review with you.  You may have some home medications which will be placed on hold until you complete the course of blood thinner medication.  It is important for you to complete the blood thinner medication as prescribed.  PRECAUTIONS:  If you experience chest pain or shortness of breath - call 911 immediately for transfer to the hospital emergency department.    If you develop a fever greater that 101 F, purulent drainage from wound, increased redness or drainage from wound, foul odor from the wound/dressing, or calf pain - CONTACT YOUR SURGEON.                                                   FOLLOW-UP APPOINTMENTS:  If you do not already have a post-op appointment, please call the office for an appointment to be seen by your surgeon.  Guidelines for how soon to be seen are listed in your "After Visit Summary", but are typically between 1-4 weeks after surgery.  OTHER INSTRUCTIONS:   Knee Replacement:  Do not place pillow under knee, focus on keeping the knee straight while resting. CPM instructions: 0-90 degrees, 2 hours in the morning, 2 hours in the afternoon, and 2 hours in the evening. Place foam block, curve side up under heel at all times except when in CPM or when walking.  DO NOT modify, tear, cut, or change the foam block in any way.  POST-OPERATIVE OPIOID TAPER INSTRUCTIONS: It is important to wean off of your opioid medication as soon as possible. If you do not need pain medication after your surgery it is ok to stop day one. Opioids include: Codeine, Hydrocodone(Norco, Vicodin), Oxycodone(Percocet, oxycontin) and hydromorphone amongst others.  Long term and even short term use of opiods can cause: Increased pain response Dependence Constipation Depression Respiratory depression And more.  Withdrawal symptoms can include Flu like symptoms Nausea, vomiting And more Techniques to manage these symptoms Hydrate well Eat regular healthy meals Stay active Use relaxation techniques(deep breathing, meditating, yoga) Do Not substitute Alcohol to help with tapering If you have been on opioids for less than two weeks and do not have pain than it is ok to stop all together.  Plan to wean off of opioids This plan should start within one week post op of your joint replacement. Maintain the same interval or time between taking each dose  and first decrease the dose.  Cut the total daily intake of opioids by one tablet each day Next start to increase the time between doses. The last dose that should be eliminated is the evening dose.   MAKE SURE YOU:  Understand these instructions.  Get help right away if you are not doing well or get worse.    Thank you for letting us be a part of your medical care team.  It is a privilege we respect greatly.  We hope these instructions will help you stay on track for a fast and full recovery!      Dental Antibiotics:  In most cases prophylactic antibiotics for Dental procdeures after total joint surgery are not necessary.  Exceptions are as follows:  1. History of prior total joint infection  2. Severely immunocompromised (Organ Transplant, cancer chemotherapy, Rheumatoid biologic meds such as Humera)  3. Poorly controlled diabetes (A1C &gt; 8.0, blood glucose over 200)  If you have one of these conditions, contact your surgeon for an antibiotic prescription, prior to your dental procedure.

## 2024-02-20 NOTE — Discharge Summary (Signed)
 Patient ID: Charlene Norris MRN: [id number] DOB/AGE: 1941-10-26 83 y.o.  Admit date: 02/18/2024 Discharge date: 02/19/2024  Admission Diagnoses:  Principal Problem:   Unilateral primary osteoarthritis, right hip Active Problems:   Status post total replacement of right hip   Discharge Diagnoses:  Same  Past Medical History:  Diagnosis Date   Allergy ?   Years ago and one recently.   Anxiety ?   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 or so years ago   UTI (urinary tract infection)     Surgeries: Procedure(s): ARTHROPLASTY, HIP, TOTAL, ANTERIOR APPROACH on 02/18/2024   Consultants:   Discharged Condition: Improved  Hospital Course: Charlene Norris is an 83 y.o. female who was admitted 02/18/2024 for operative treatment ofUnilateral primary osteoarthritis, right hip. Patient has severe unremitting pain that affects sleep, daily activities, and work/hobbies. After pre-op clearance the patient was taken to the operating room on 02/18/2024 and underwent  Procedure(s): ARTHROPLASTY, HIP, TOTAL, ANTERIOR APPROACH.    Patient was given perioperative antibiotics:  Anti-infectives (From admission, onward)    Start     Dose/Rate Route Frequency Ordered Stop   02/18/24 1500  ceFAZolin (ANCEF) IVPB 2g/100 mL premix        2 g 200 mL/hr over 30 Minutes Intravenous Every 6 hours 02/18/24 1242 02/18/24 2115    02/18/24 0615  ceFAZolin (ANCEF) IVPB 2g/100 mL premix        2 g 200 mL/hr over 30 Minutes Intravenous On call to O.R. 02/18/24 1478 02/18/24 0909        Patient was given sequential compression devices, early ambulation, and chemoprophylaxis to prevent DVT.  Patient benefited maximally from hospital stay and there were no complications.    Recent vital signs: No data found.   Recent laboratory studies:  Recent Labs    02/19/24 0335  WBC 10.7*  HGB 11.8*  HCT 37.2  PLT 155  NA 138  K 4.2  CL 105  CO2 26  BUN 14  CREATININE 0.66  GLUCOSE 123*  CALCIUM 8.5*     Discharge Medications:   Allergies as of 02/19/2024       Reactions   Levofloxacin Shortness Of Breath   Chest pressure, head pressure   Ampicillin Itching   Celebrex [celecoxib] Hives   Nexium [esomeprazole Magnesium] Hives, Diarrhea   Ranitidine Itching   Strawberry (diagnostic) Hives   Zyrtec [cetirizine] Diarrhea   Feel ill        Medication List     TAKE these medications    acetaminophen 325 MG tablet Commonly known as: TYLENOL Take 325 mg by mouth every 8 (eight) hours as needed for moderate pain (pain score 4-6).   denosumab 60 MG/ML Sosy injection Commonly known as: PROLIA Inject 60 mg into the skin every 6 (six) months. Most recent injection  03/15/2023   Eliquis 5 MG Tabs tablet Generic drug: apixaban TAKE 1 TABLET(5 MG) BY MOUTH TWICE DAILY   GLUCOSAMINE CHOND MSM FORMULA PO Take 1 tablet by mouth daily as needed (Patient Prefrence).   HYDROcodone-acetaminophen 5-325 MG tablet Commonly known as: NORCO/VICODIN Take 1-2 tablets by mouth every 6 (six) hours as needed for moderate pain (pain score 4-6).   ibuprofen 200 MG tablet Commonly known as: ADVIL Take 200 mg by mouth every 8 (eight) hours as needed for moderate pain (pain score 4-6) or mild pain (pain score 1-3). Advil   lisinopril 20 MG tablet Commonly known as: ZESTRIL Take 1 tablet (20 mg total) by mouth daily.    lovastatin 40 MG tablet Commonly known as: MEVACOR TAKE 1 TABLET(40 MG) BY MOUTH IN THE MORNING   methocarbamol 500 MG tablet Commonly known as: ROBAXIN Take 1 tablet (500 mg total) by mouth every 6 (six) hours as needed for muscle spasms.   metoprolol succinate 25 MG 24 hr tablet Commonly known as: TOPROL-XL TAKE 1/2 TABLET(12.5 MG) BY MOUTH DAILY   Vitamin D (Ergocalciferol) 1.25 MG (50000 UNIT) Caps capsule Commonly known as: DRISDOL Take 1 capsule (50,000 Units total) by mouth every 7 (seven) days.   Zinc 10 MG Lozg Use as directed 1 each in the mouth or throat as needed (cold symptoms).        Diagnostic Studies: DG HIP UNILAT WITH PELVIS 1V RIGHT Result Date: 02/18/2024 CLINICAL DATA:  Elective surgery. EXAM: DG HIP (WITH OR WITHOUT PELVIS) 1V RIGHT COMPARISON:  None Available. FINDINGS: Three fluoroscopic spot views of the pelvis and right hip obtained in the operating room. Images during hip arthroplasty. Fluoroscopy time 17 seconds. Dose 2.06 mGy. IMPRESSION: Intraoperative fluoroscopy during right hip arthroplasty. Electronically Signed   By: Chadwick Colonel M.D.   On: 02/18/2024 12:37   DG Pelvis Portable Result Date: 02/18/2024 CLINICAL DATA:  Status post right hip replacement. EXAM: PORTABLE PELVIS 1-2 VIEWS COMPARISON:  None Available. FINDINGS: Right hip arthroplasty in expected alignment. No periprosthetic lucency or fracture. Recent postsurgical change includes air and edema in the soft tissues. Lateral skin staples in place. IMPRESSION: Right hip arthroplasty without immediate postoperative complication. Electronically Signed   By: Chadwick Colonel M.D.   On: 02/18/2024 12:36   DG C-Arm 1-60 Min-No Report Result Date: 02/18/2024 Fluoroscopy was utilized by the requesting physician.  No radiographic interpretation.    Disposition: Discharge disposition: 01-Home or Self Care          Follow-up Information     Arnie Lao, MD Follow up in 2  week(s).   Specialty: Orthopedic Surgery Contact information: [address] [place]  [place] [address] [phone]                  Signed: Arnie Lao 02/20/2024, 6:54 PM

## 2024-02-21 ENCOUNTER — Encounter (HOSPITAL_COMMUNITY): Payer: Self-pay | Admitting: Orthopaedic Surgery

## 2024-02-21 ENCOUNTER — Telehealth: Payer: Self-pay | Admitting: Orthopaedic Surgery

## 2024-02-21 DIAGNOSIS — E663 Overweight: Secondary | ICD-10-CM | POA: Diagnosis not present

## 2024-02-21 DIAGNOSIS — Z87440 Personal history of urinary (tract) infections: Secondary | ICD-10-CM | POA: Diagnosis not present

## 2024-02-21 DIAGNOSIS — R1310 Dysphagia, unspecified: Secondary | ICD-10-CM | POA: Diagnosis not present

## 2024-02-21 DIAGNOSIS — K219 Gastro-esophageal reflux disease without esophagitis: Secondary | ICD-10-CM | POA: Diagnosis not present

## 2024-02-21 DIAGNOSIS — I480 Paroxysmal atrial fibrillation: Secondary | ICD-10-CM | POA: Diagnosis not present

## 2024-02-21 DIAGNOSIS — Z87891 Personal history of nicotine dependence: Secondary | ICD-10-CM | POA: Diagnosis not present

## 2024-02-21 DIAGNOSIS — Z5982 Transportation insecurity: Secondary | ICD-10-CM | POA: Diagnosis not present

## 2024-02-21 DIAGNOSIS — I10 Essential (primary) hypertension: Secondary | ICD-10-CM | POA: Diagnosis not present

## 2024-02-21 DIAGNOSIS — Z7901 Long term (current) use of anticoagulants: Secondary | ICD-10-CM | POA: Diagnosis not present

## 2024-02-21 DIAGNOSIS — F419 Anxiety disorder, unspecified: Secondary | ICD-10-CM | POA: Diagnosis not present

## 2024-02-21 DIAGNOSIS — Z96641 Presence of right artificial hip joint: Secondary | ICD-10-CM | POA: Diagnosis not present

## 2024-02-21 DIAGNOSIS — M810 Age-related osteoporosis without current pathological fracture: Secondary | ICD-10-CM | POA: Diagnosis not present

## 2024-02-21 DIAGNOSIS — I6522 Occlusion and stenosis of left carotid artery: Secondary | ICD-10-CM | POA: Diagnosis not present

## 2024-02-21 DIAGNOSIS — E785 Hyperlipidemia, unspecified: Secondary | ICD-10-CM | POA: Diagnosis not present

## 2024-02-21 DIAGNOSIS — Z6829 Body mass index (BMI) 29.0-29.9, adult: Secondary | ICD-10-CM | POA: Diagnosis not present

## 2024-02-21 DIAGNOSIS — E559 Vitamin D deficiency, unspecified: Secondary | ICD-10-CM | POA: Diagnosis not present

## 2024-02-21 DIAGNOSIS — E119 Type 2 diabetes mellitus without complications: Secondary | ICD-10-CM | POA: Diagnosis not present

## 2024-02-21 DIAGNOSIS — Z96653 Presence of artificial knee joint, bilateral: Secondary | ICD-10-CM | POA: Diagnosis not present

## 2024-02-21 DIAGNOSIS — Z9181 History of falling: Secondary | ICD-10-CM | POA: Diagnosis not present

## 2024-02-21 DIAGNOSIS — J309 Allergic rhinitis, unspecified: Secondary | ICD-10-CM | POA: Diagnosis not present

## 2024-02-21 DIAGNOSIS — Z471 Aftercare following joint replacement surgery: Secondary | ICD-10-CM | POA: Diagnosis not present

## 2024-02-21 DIAGNOSIS — M159 Polyosteoarthritis, unspecified: Secondary | ICD-10-CM | POA: Diagnosis not present

## 2024-02-21 DIAGNOSIS — M5440 Lumbago with sciatica, unspecified side: Secondary | ICD-10-CM | POA: Diagnosis not present

## 2024-02-21 NOTE — Telephone Encounter (Signed)
 Verbal order given

## 2024-02-21 NOTE — Telephone Encounter (Signed)
 Soni (PT) from [facility] called requesting verbal orders for 3 wk 2, and 2wk 1. Soni secure number is [phone].

## 2024-02-23 DIAGNOSIS — M5440 Lumbago with sciatica, unspecified side: Secondary | ICD-10-CM | POA: Diagnosis not present

## 2024-02-23 DIAGNOSIS — J309 Allergic rhinitis, unspecified: Secondary | ICD-10-CM | POA: Diagnosis not present

## 2024-02-23 DIAGNOSIS — Z471 Aftercare following joint replacement surgery: Secondary | ICD-10-CM | POA: Diagnosis not present

## 2024-02-23 DIAGNOSIS — I10 Essential (primary) hypertension: Secondary | ICD-10-CM | POA: Diagnosis not present

## 2024-02-23 DIAGNOSIS — E559 Vitamin D deficiency, unspecified: Secondary | ICD-10-CM | POA: Diagnosis not present

## 2024-02-23 DIAGNOSIS — E785 Hyperlipidemia, unspecified: Secondary | ICD-10-CM | POA: Diagnosis not present

## 2024-02-23 DIAGNOSIS — E119 Type 2 diabetes mellitus without complications: Secondary | ICD-10-CM | POA: Diagnosis not present

## 2024-02-23 DIAGNOSIS — Z96641 Presence of right artificial hip joint: Secondary | ICD-10-CM | POA: Diagnosis not present

## 2024-02-23 DIAGNOSIS — E663 Overweight: Secondary | ICD-10-CM | POA: Diagnosis not present

## 2024-02-23 DIAGNOSIS — I480 Paroxysmal atrial fibrillation: Secondary | ICD-10-CM | POA: Diagnosis not present

## 2024-02-23 DIAGNOSIS — R1310 Dysphagia, unspecified: Secondary | ICD-10-CM | POA: Diagnosis not present

## 2024-02-23 DIAGNOSIS — I6522 Occlusion and stenosis of left carotid artery: Secondary | ICD-10-CM | POA: Diagnosis not present

## 2024-02-23 DIAGNOSIS — M810 Age-related osteoporosis without current pathological fracture: Secondary | ICD-10-CM | POA: Diagnosis not present

## 2024-02-23 DIAGNOSIS — K219 Gastro-esophageal reflux disease without esophagitis: Secondary | ICD-10-CM | POA: Diagnosis not present

## 2024-02-23 DIAGNOSIS — M159 Polyosteoarthritis, unspecified: Secondary | ICD-10-CM | POA: Diagnosis not present

## 2024-02-23 DIAGNOSIS — F419 Anxiety disorder, unspecified: Secondary | ICD-10-CM | POA: Diagnosis not present

## 2024-02-24 ENCOUNTER — Other Ambulatory Visit: Payer: Self-pay | Admitting: Cardiovascular Disease

## 2024-02-25 DIAGNOSIS — E785 Hyperlipidemia, unspecified: Secondary | ICD-10-CM | POA: Diagnosis not present

## 2024-02-25 DIAGNOSIS — M5440 Lumbago with sciatica, unspecified side: Secondary | ICD-10-CM | POA: Diagnosis not present

## 2024-02-25 DIAGNOSIS — Z471 Aftercare following joint replacement surgery: Secondary | ICD-10-CM | POA: Diagnosis not present

## 2024-02-25 DIAGNOSIS — Z96641 Presence of right artificial hip joint: Secondary | ICD-10-CM | POA: Diagnosis not present

## 2024-02-25 DIAGNOSIS — R1310 Dysphagia, unspecified: Secondary | ICD-10-CM | POA: Diagnosis not present

## 2024-02-25 DIAGNOSIS — I10 Essential (primary) hypertension: Secondary | ICD-10-CM | POA: Diagnosis not present

## 2024-02-25 DIAGNOSIS — J309 Allergic rhinitis, unspecified: Secondary | ICD-10-CM | POA: Diagnosis not present

## 2024-02-25 DIAGNOSIS — E559 Vitamin D deficiency, unspecified: Secondary | ICD-10-CM | POA: Diagnosis not present

## 2024-02-25 DIAGNOSIS — E119 Type 2 diabetes mellitus without complications: Secondary | ICD-10-CM | POA: Diagnosis not present

## 2024-02-25 DIAGNOSIS — M810 Age-related osteoporosis without current pathological fracture: Secondary | ICD-10-CM | POA: Diagnosis not present

## 2024-02-25 DIAGNOSIS — K219 Gastro-esophageal reflux disease without esophagitis: Secondary | ICD-10-CM | POA: Diagnosis not present

## 2024-02-25 DIAGNOSIS — I6522 Occlusion and stenosis of left carotid artery: Secondary | ICD-10-CM | POA: Diagnosis not present

## 2024-02-25 DIAGNOSIS — F419 Anxiety disorder, unspecified: Secondary | ICD-10-CM | POA: Diagnosis not present

## 2024-02-25 DIAGNOSIS — M159 Polyosteoarthritis, unspecified: Secondary | ICD-10-CM | POA: Diagnosis not present

## 2024-02-25 DIAGNOSIS — I480 Paroxysmal atrial fibrillation: Secondary | ICD-10-CM | POA: Diagnosis not present

## 2024-02-25 DIAGNOSIS — E663 Overweight: Secondary | ICD-10-CM | POA: Diagnosis not present

## 2024-02-28 ENCOUNTER — Telehealth: Payer: Self-pay

## 2024-02-28 NOTE — Telephone Encounter (Signed)
 Patient left voice mail stating she had THA on 02/18/24.  She has swelling in her foot.  She has a rash on her leg.  Wants to know about wearing compression socks.  Please call.  [phone]

## 2024-02-28 NOTE — Telephone Encounter (Signed)
 Patient aware of the below message

## 2024-02-29 DIAGNOSIS — I480 Paroxysmal atrial fibrillation: Secondary | ICD-10-CM | POA: Diagnosis not present

## 2024-02-29 DIAGNOSIS — J309 Allergic rhinitis, unspecified: Secondary | ICD-10-CM | POA: Diagnosis not present

## 2024-02-29 DIAGNOSIS — M159 Polyosteoarthritis, unspecified: Secondary | ICD-10-CM | POA: Diagnosis not present

## 2024-02-29 DIAGNOSIS — E663 Overweight: Secondary | ICD-10-CM | POA: Diagnosis not present

## 2024-02-29 DIAGNOSIS — E119 Type 2 diabetes mellitus without complications: Secondary | ICD-10-CM | POA: Diagnosis not present

## 2024-02-29 DIAGNOSIS — Z96641 Presence of right artificial hip joint: Secondary | ICD-10-CM | POA: Diagnosis not present

## 2024-02-29 DIAGNOSIS — I10 Essential (primary) hypertension: Secondary | ICD-10-CM | POA: Diagnosis not present

## 2024-02-29 DIAGNOSIS — E559 Vitamin D deficiency, unspecified: Secondary | ICD-10-CM | POA: Diagnosis not present

## 2024-02-29 DIAGNOSIS — I6522 Occlusion and stenosis of left carotid artery: Secondary | ICD-10-CM | POA: Diagnosis not present

## 2024-02-29 DIAGNOSIS — M5440 Lumbago with sciatica, unspecified side: Secondary | ICD-10-CM | POA: Diagnosis not present

## 2024-02-29 DIAGNOSIS — F419 Anxiety disorder, unspecified: Secondary | ICD-10-CM | POA: Diagnosis not present

## 2024-02-29 DIAGNOSIS — M810 Age-related osteoporosis without current pathological fracture: Secondary | ICD-10-CM | POA: Diagnosis not present

## 2024-02-29 DIAGNOSIS — K219 Gastro-esophageal reflux disease without esophagitis: Secondary | ICD-10-CM | POA: Diagnosis not present

## 2024-02-29 DIAGNOSIS — R1310 Dysphagia, unspecified: Secondary | ICD-10-CM | POA: Diagnosis not present

## 2024-02-29 DIAGNOSIS — Z471 Aftercare following joint replacement surgery: Secondary | ICD-10-CM | POA: Diagnosis not present

## 2024-02-29 DIAGNOSIS — E785 Hyperlipidemia, unspecified: Secondary | ICD-10-CM | POA: Diagnosis not present

## 2024-03-02 ENCOUNTER — Encounter: Payer: Self-pay | Admitting: Orthopaedic Surgery

## 2024-03-02 ENCOUNTER — Ambulatory Visit: Admitting: Orthopaedic Surgery

## 2024-03-02 DIAGNOSIS — R1310 Dysphagia, unspecified: Secondary | ICD-10-CM | POA: Diagnosis not present

## 2024-03-02 DIAGNOSIS — Z96641 Presence of right artificial hip joint: Secondary | ICD-10-CM | POA: Diagnosis not present

## 2024-03-02 DIAGNOSIS — E559 Vitamin D deficiency, unspecified: Secondary | ICD-10-CM | POA: Diagnosis not present

## 2024-03-02 DIAGNOSIS — J309 Allergic rhinitis, unspecified: Secondary | ICD-10-CM | POA: Diagnosis not present

## 2024-03-02 DIAGNOSIS — K219 Gastro-esophageal reflux disease without esophagitis: Secondary | ICD-10-CM | POA: Diagnosis not present

## 2024-03-02 DIAGNOSIS — M159 Polyosteoarthritis, unspecified: Secondary | ICD-10-CM | POA: Diagnosis not present

## 2024-03-02 DIAGNOSIS — E663 Overweight: Secondary | ICD-10-CM | POA: Diagnosis not present

## 2024-03-02 DIAGNOSIS — M5440 Lumbago with sciatica, unspecified side: Secondary | ICD-10-CM | POA: Diagnosis not present

## 2024-03-02 DIAGNOSIS — M810 Age-related osteoporosis without current pathological fracture: Secondary | ICD-10-CM | POA: Diagnosis not present

## 2024-03-02 DIAGNOSIS — I480 Paroxysmal atrial fibrillation: Secondary | ICD-10-CM | POA: Diagnosis not present

## 2024-03-02 DIAGNOSIS — I6522 Occlusion and stenosis of left carotid artery: Secondary | ICD-10-CM | POA: Diagnosis not present

## 2024-03-02 DIAGNOSIS — F419 Anxiety disorder, unspecified: Secondary | ICD-10-CM | POA: Diagnosis not present

## 2024-03-02 DIAGNOSIS — E119 Type 2 diabetes mellitus without complications: Secondary | ICD-10-CM | POA: Diagnosis not present

## 2024-03-02 DIAGNOSIS — I10 Essential (primary) hypertension: Secondary | ICD-10-CM | POA: Diagnosis not present

## 2024-03-02 DIAGNOSIS — E785 Hyperlipidemia, unspecified: Secondary | ICD-10-CM | POA: Diagnosis not present

## 2024-03-02 DIAGNOSIS — Z471 Aftercare following joint replacement surgery: Secondary | ICD-10-CM | POA: Diagnosis not present

## 2024-03-02 NOTE — Progress Notes (Signed)
 The patient is here for first postoperative visit status post a right total hip replacement.  She is an active 83 year old female.  She has 1 more week of home health therapy.  She is ambulating with a cane and states she is doing well.  She does have a history of her knees being replaced.  She is on chronic Eliquis  for atrial fibrillation.  She has been wearing compressive garments but she can stop wearing these.  Her right hip moves smoothly and fluidly.  Staples are removed and Steri-Strips applied.  She does have some arthritis in her left hip the right now that moves well.  From our standpoint she will continue increase her activities as she tolerates.  She can drive.  We will see her back in a month to see how she is doing overall but no x-rays are needed.

## 2024-03-03 DIAGNOSIS — Z471 Aftercare following joint replacement surgery: Secondary | ICD-10-CM | POA: Diagnosis not present

## 2024-03-03 DIAGNOSIS — E119 Type 2 diabetes mellitus without complications: Secondary | ICD-10-CM | POA: Diagnosis not present

## 2024-03-03 DIAGNOSIS — E663 Overweight: Secondary | ICD-10-CM | POA: Diagnosis not present

## 2024-03-03 DIAGNOSIS — Z96641 Presence of right artificial hip joint: Secondary | ICD-10-CM | POA: Diagnosis not present

## 2024-03-03 DIAGNOSIS — I6522 Occlusion and stenosis of left carotid artery: Secondary | ICD-10-CM | POA: Diagnosis not present

## 2024-03-03 DIAGNOSIS — I10 Essential (primary) hypertension: Secondary | ICD-10-CM | POA: Diagnosis not present

## 2024-03-03 DIAGNOSIS — M159 Polyosteoarthritis, unspecified: Secondary | ICD-10-CM | POA: Diagnosis not present

## 2024-03-03 DIAGNOSIS — E785 Hyperlipidemia, unspecified: Secondary | ICD-10-CM | POA: Diagnosis not present

## 2024-03-03 DIAGNOSIS — K219 Gastro-esophageal reflux disease without esophagitis: Secondary | ICD-10-CM | POA: Diagnosis not present

## 2024-03-03 DIAGNOSIS — J309 Allergic rhinitis, unspecified: Secondary | ICD-10-CM | POA: Diagnosis not present

## 2024-03-03 DIAGNOSIS — E559 Vitamin D deficiency, unspecified: Secondary | ICD-10-CM | POA: Diagnosis not present

## 2024-03-03 DIAGNOSIS — F419 Anxiety disorder, unspecified: Secondary | ICD-10-CM | POA: Diagnosis not present

## 2024-03-03 DIAGNOSIS — M5440 Lumbago with sciatica, unspecified side: Secondary | ICD-10-CM | POA: Diagnosis not present

## 2024-03-03 DIAGNOSIS — M810 Age-related osteoporosis without current pathological fracture: Secondary | ICD-10-CM | POA: Diagnosis not present

## 2024-03-03 DIAGNOSIS — R1310 Dysphagia, unspecified: Secondary | ICD-10-CM | POA: Diagnosis not present

## 2024-03-03 DIAGNOSIS — I480 Paroxysmal atrial fibrillation: Secondary | ICD-10-CM | POA: Diagnosis not present

## 2024-03-07 DIAGNOSIS — M810 Age-related osteoporosis without current pathological fracture: Secondary | ICD-10-CM | POA: Diagnosis not present

## 2024-03-07 DIAGNOSIS — E785 Hyperlipidemia, unspecified: Secondary | ICD-10-CM | POA: Diagnosis not present

## 2024-03-07 DIAGNOSIS — R1310 Dysphagia, unspecified: Secondary | ICD-10-CM | POA: Diagnosis not present

## 2024-03-07 DIAGNOSIS — J309 Allergic rhinitis, unspecified: Secondary | ICD-10-CM | POA: Diagnosis not present

## 2024-03-07 DIAGNOSIS — E119 Type 2 diabetes mellitus without complications: Secondary | ICD-10-CM | POA: Diagnosis not present

## 2024-03-07 DIAGNOSIS — Z471 Aftercare following joint replacement surgery: Secondary | ICD-10-CM | POA: Diagnosis not present

## 2024-03-07 DIAGNOSIS — I480 Paroxysmal atrial fibrillation: Secondary | ICD-10-CM | POA: Diagnosis not present

## 2024-03-07 DIAGNOSIS — I10 Essential (primary) hypertension: Secondary | ICD-10-CM | POA: Diagnosis not present

## 2024-03-07 DIAGNOSIS — Z96641 Presence of right artificial hip joint: Secondary | ICD-10-CM | POA: Diagnosis not present

## 2024-03-07 DIAGNOSIS — K219 Gastro-esophageal reflux disease without esophagitis: Secondary | ICD-10-CM | POA: Diagnosis not present

## 2024-03-07 DIAGNOSIS — I6522 Occlusion and stenosis of left carotid artery: Secondary | ICD-10-CM | POA: Diagnosis not present

## 2024-03-07 DIAGNOSIS — F419 Anxiety disorder, unspecified: Secondary | ICD-10-CM | POA: Diagnosis not present

## 2024-03-07 DIAGNOSIS — M5440 Lumbago with sciatica, unspecified side: Secondary | ICD-10-CM | POA: Diagnosis not present

## 2024-03-07 DIAGNOSIS — M159 Polyosteoarthritis, unspecified: Secondary | ICD-10-CM | POA: Diagnosis not present

## 2024-03-07 DIAGNOSIS — E559 Vitamin D deficiency, unspecified: Secondary | ICD-10-CM | POA: Diagnosis not present

## 2024-03-07 DIAGNOSIS — E663 Overweight: Secondary | ICD-10-CM | POA: Diagnosis not present

## 2024-03-09 DIAGNOSIS — R1310 Dysphagia, unspecified: Secondary | ICD-10-CM | POA: Diagnosis not present

## 2024-03-09 DIAGNOSIS — M5440 Lumbago with sciatica, unspecified side: Secondary | ICD-10-CM | POA: Diagnosis not present

## 2024-03-09 DIAGNOSIS — Z96641 Presence of right artificial hip joint: Secondary | ICD-10-CM | POA: Diagnosis not present

## 2024-03-09 DIAGNOSIS — F419 Anxiety disorder, unspecified: Secondary | ICD-10-CM | POA: Diagnosis not present

## 2024-03-09 DIAGNOSIS — K219 Gastro-esophageal reflux disease without esophagitis: Secondary | ICD-10-CM | POA: Diagnosis not present

## 2024-03-09 DIAGNOSIS — E119 Type 2 diabetes mellitus without complications: Secondary | ICD-10-CM | POA: Diagnosis not present

## 2024-03-09 DIAGNOSIS — I10 Essential (primary) hypertension: Secondary | ICD-10-CM | POA: Diagnosis not present

## 2024-03-09 DIAGNOSIS — I6522 Occlusion and stenosis of left carotid artery: Secondary | ICD-10-CM | POA: Diagnosis not present

## 2024-03-09 DIAGNOSIS — J309 Allergic rhinitis, unspecified: Secondary | ICD-10-CM | POA: Diagnosis not present

## 2024-03-09 DIAGNOSIS — M810 Age-related osteoporosis without current pathological fracture: Secondary | ICD-10-CM | POA: Diagnosis not present

## 2024-03-09 DIAGNOSIS — Z7901 Long term (current) use of anticoagulants: Secondary | ICD-10-CM | POA: Diagnosis not present

## 2024-03-09 DIAGNOSIS — Z87891 Personal history of nicotine dependence: Secondary | ICD-10-CM | POA: Diagnosis not present

## 2024-03-09 DIAGNOSIS — Z6829 Body mass index (BMI) 29.0-29.9, adult: Secondary | ICD-10-CM | POA: Diagnosis not present

## 2024-03-09 DIAGNOSIS — Z471 Aftercare following joint replacement surgery: Secondary | ICD-10-CM | POA: Diagnosis not present

## 2024-03-09 DIAGNOSIS — M159 Polyosteoarthritis, unspecified: Secondary | ICD-10-CM | POA: Diagnosis not present

## 2024-03-09 DIAGNOSIS — Z5982 Transportation insecurity: Secondary | ICD-10-CM | POA: Diagnosis not present

## 2024-03-09 DIAGNOSIS — Z96653 Presence of artificial knee joint, bilateral: Secondary | ICD-10-CM | POA: Diagnosis not present

## 2024-03-09 DIAGNOSIS — Z87440 Personal history of urinary (tract) infections: Secondary | ICD-10-CM | POA: Diagnosis not present

## 2024-03-09 DIAGNOSIS — E559 Vitamin D deficiency, unspecified: Secondary | ICD-10-CM | POA: Diagnosis not present

## 2024-03-09 DIAGNOSIS — I480 Paroxysmal atrial fibrillation: Secondary | ICD-10-CM | POA: Diagnosis not present

## 2024-03-09 DIAGNOSIS — E785 Hyperlipidemia, unspecified: Secondary | ICD-10-CM | POA: Diagnosis not present

## 2024-03-09 DIAGNOSIS — Z9181 History of falling: Secondary | ICD-10-CM | POA: Diagnosis not present

## 2024-03-09 DIAGNOSIS — E663 Overweight: Secondary | ICD-10-CM | POA: Diagnosis not present

## 2024-03-29 ENCOUNTER — Encounter: Payer: Self-pay | Admitting: Family Medicine

## 2024-03-29 ENCOUNTER — Encounter: Admitting: Orthopaedic Surgery

## 2024-03-31 ENCOUNTER — Ambulatory Visit: Payer: Medicare Other | Admitting: Family Medicine

## 2024-04-05 ENCOUNTER — Ambulatory Visit (INDEPENDENT_AMBULATORY_CARE_PROVIDER_SITE_OTHER): Admitting: Physician Assistant

## 2024-04-05 ENCOUNTER — Encounter: Payer: Self-pay | Admitting: Physician Assistant

## 2024-04-05 DIAGNOSIS — Z96641 Presence of right artificial hip joint: Secondary | ICD-10-CM

## 2024-04-05 NOTE — Progress Notes (Signed)
 HPI: [name] returns today almost 6 weeks status post right total hip arthroplasty.  She states she is doing great.  She is walking with a cane today but states she only uses a cane when out of the home.  She is finished up with home physical therapy.  She is on chronic Eliquis .  Her only complaint is some shortness of breath with activities.  Denies any orthopnea.  She denies any chest pain.  She is due to see her primary care physician next week.  She is only taking Tylenol  for the hip pain.   Review of systems: See HPI otherwise negative  Physical exam: General Well-developed well-nourished female in no acute distress.  Ambulates with a nonantalgic gait with the use of a cane. Respirations: Nonlabored on room air Right hip: Surgical incisions healing well no signs of infection or seroma.  Good range of motion of the right hip without pain.  Dorsiflexion plantarflexion right ankle intact.  Calf supple nontender.  Impression: Status post right total hip arthroplasty 02/18/2024  Plan: She will work on scar tissue mobilization.  She will discuss her shortness of breath with her primary care physician next week.  From orthopedic standpoint she is activities as tolerated..  She is given a dental note stating that he if she needs any dental procedures in the first 3 months she needs to have antibiotics.  Will have her follow-up with us  at the 46-month follow-up and obtain a low AP pelvis.  She will follow-up with us  sooner if there is any questions concerns.

## 2024-04-10 ENCOUNTER — Encounter: Payer: Self-pay | Admitting: Orthopaedic Surgery

## 2024-04-12 ENCOUNTER — Ambulatory Visit: Admitting: Family Medicine

## 2024-04-12 ENCOUNTER — Encounter: Payer: Self-pay | Admitting: Family Medicine

## 2024-04-12 VITALS — BP 160/53 | HR 59 | Resp 14 | Ht 61.0 in | Wt 159.2 lb

## 2024-04-12 DIAGNOSIS — Z96641 Presence of right artificial hip joint: Secondary | ICD-10-CM | POA: Diagnosis not present

## 2024-04-12 DIAGNOSIS — I10 Essential (primary) hypertension: Secondary | ICD-10-CM

## 2024-04-12 NOTE — Progress Notes (Signed)
 Established patient visit   Patient: Charlene Norris   DOB: February 04, 1941   82 y.o. Female  MRN: [id number] Visit Date: 04/12/2024  Today's healthcare provider: Carlean Charter, DO   Chief Complaint  Patient presents with   Hypertension   Subjective    Hypertension  Charlene Norris is an 83 year old female with hypertension who presents with concerns about blood pressure management post-surgery.  Her blood pressure has been fluctuating since her surgery on February 18, 2024. Previously, it was well-controlled at 140/80 mmHg and even reached 120/80 mmHg in April. However, recent readings have been higher, with one measurement reaching 170/80 mmHg. She is concerned about a possible issue with her lisinopril  prescription, which was supposed to be increased from 10 mg to 20 mg, but she is unsure if the pharmacy filled it correctly.  She underwent surgery on February 18, 2024, and is still in the recovery phase. She has been more active recently, engaging in activities like walking and visiting thrift shops, but she has gained 10 pounds since the surgery.  She experiences difficulty sleeping, which she attributes to her ongoing recovery and possibly her age, as she will be 51 soon. She also mentions some memory issues, which she associates with her lack of sleep and age.  She has a history of receiving one shingles vaccine several years ago but did not complete the series. She also received a pneumonia vaccine with her previous doctor (PVC-23 in 2015) but does not appear to have received any additional. No chest pain or shortness of breath.      Medications: Outpatient Medications Prior to Visit  Medication Sig   acetaminophen  (TYLENOL ) 325 MG tablet Take 325 mg by mouth every 8 (eight) hours as needed for moderate pain (pain score 4-6).   apixaban  (ELIQUIS ) 5 MG TABS tablet TAKE 1 TABLET(5 MG) BY MOUTH TWICE DAILY   denosumab  (PROLIA ) 60 MG/ML SOSY injection Inject 60 mg into the skin  every 6 (six) months. Most recent injection 03/15/2023   Glucosamine Sulfate 500 MG CAPS Take by mouth as needed.   ibuprofen (ADVIL) 200 MG tablet Take 200 mg by mouth every 8 (eight) hours as needed for moderate pain (pain score 4-6) or mild pain (pain score 1-3). Advil   lisinopril  (ZESTRIL ) 20 MG tablet Take 1 tablet (20 mg total) by mouth daily.   lovastatin  (MEVACOR ) 40 MG tablet TAKE 1 TABLET(40 MG) BY MOUTH IN THE MORNING   methocarbamol  (ROBAXIN ) 500 MG tablet Take 1 tablet (500 mg total) by mouth every 6 (six) hours as needed for muscle spasms.   metoprolol  succinate (TOPROL -XL) 25 MG 24 hr tablet TAKE 1/2 TABLET(12.5 MG) BY MOUTH DAILY   Misc Natural Products (GLUCOSAMINE CHOND MSM FORMULA PO) Take 1 tablet by mouth daily as needed (Patient Prefrence).   Vitamin D , Ergocalciferol , (DRISDOL ) 1.25 MG (50000 UNIT) CAPS capsule Take 1 capsule (50,000 Units total) by mouth every 7 (seven) days.   Zinc 10 MG LOZG Use as directed 1 each in the mouth or throat as needed (cold symptoms).   No facility-administered medications prior to visit.        Objective    BP (!) 160/53 (BP Location: Right Arm, Patient Position: Sitting, Cuff Size: Large)   Pulse (!) 59   Resp 14   Ht 5\' 1"  (1.549 m)   Wt 159 lb 3.2 oz (72.2 kg)   SpO2 100%   BMI 30.08 kg/m  Physical Exam Vitals and nursing note reviewed.  Constitutional:      General: She is not in acute distress.    Appearance: Normal appearance.  HENT:     Head: Normocephalic and atraumatic.  Eyes:     General: No scleral icterus.    Conjunctiva/sclera: Conjunctivae normal.  Cardiovascular:     Rate and Rhythm: Normal rate.  Pulmonary:     Effort: Pulmonary effort is normal.  Neurological:     Mental Status: She is alert and oriented to person, place, and time. Mental status is at baseline.  Psychiatric:        Mood and Affect: Mood normal.        Behavior: Behavior normal.      No results found for any visits on  04/12/24.  Assessment & Plan    Primary hypertension  Status post total replacement of right hip     Hypertension Blood pressure elevated at 150-170 mmHg. Lisinopril  dosage may not have been increased as planned. Recent surgery may affect readings. Home monitoring not done recently. - Verify lisinopril  dosage is 20 mg. - Initiate daily home blood pressure monitoring for two weeks. - Bring blood pressure cuff and records to next appointment.   Status post total replacement of right hip  Improving activity levels post-hip surgery. Plans for future left hip replacement next year. Walking recommended by surgeon.  General Health Maintenance Incomplete shingles vaccine series. Unclear pneumonia vaccine history. Discussed completing shingles series and Prevnar 20 option. - Recommend completing shingles vaccine series. - Consider Prevnar 20 vaccine for pneumonia.    Return in about 10 weeks (around 06/21/2024).      I discussed the assessment and treatment plan with the patient  The patient was provided an opportunity to ask questions and all were answered. The patient agreed with the plan and demonstrated an understanding of the instructions.   The patient was advised to call back or seek an in-person evaluation if the symptoms worsen or if the condition fails to improve as anticipated.    Carlean Charter, DO  [facility] [phone] (phone) [phone] (fax)  [facility]

## 2024-04-12 NOTE — Patient Instructions (Addendum)
 Check lisinopril  dosage - it should be 20 mg daily.  Recommended vaccines: shingrix (shingles) 2nd dose and prevnar-20 or -21 (pneumonia)    Check your blood pressure once daily, and any time you have concerning symptoms like headache, chest pain, dizziness, shortness of breath, or vision changes.   Our goal is less than 140/90.  To appropriately check your blood pressure, make sure you do the following:  1) Avoid caffeine, exercise, or tobacco products for 30 minutes before checking. Empty your bladder. 2) Sit with your back supported in a flat-backed chair. Rest your arm on something flat (arm of the chair, table, etc). 3) Sit still with your feet flat on the floor, resting, for at least 5 minutes.  4) Check your blood pressure. Take 1-2 readings.  5) Write down these readings and bring with you to any provider appointments.  Bring your home blood pressure machine with you to a provider's office for accuracy comparison at least once a year.   Make sure you take your blood pressure medications before you come to any office visit, even if you were asked to fast for labs.

## 2024-05-02 ENCOUNTER — Other Ambulatory Visit: Payer: Self-pay | Admitting: Family Medicine

## 2024-05-05 ENCOUNTER — Ambulatory Visit: Admitting: Family Medicine

## 2024-05-15 ENCOUNTER — Encounter: Payer: Self-pay | Admitting: Orthopaedic Surgery

## 2024-05-15 NOTE — Progress Notes (Unsigned)
 Cardiology Office Note    Date:  05/16/2024   ID:  Charlene Norris, DOB June 27, 1941, MRN [id number]  PCP:  Charlene Lauraine SAILOR, DO  Cardiologist:  Charlene Lunger, MD  Electrophysiologist:  None   Chief Complaint: Follow up  History of Present Illness:   Charlene Norris is a 83 y.o. female with history of PAF, pancreatitis, left-sided carotid artery stenosis, hiatal hernia, HTN, aortic atherosclerosis, HLD, prediabetes, and GERD who presents for follow-up of A-fib.   Remote EKG from 2010 showed A-fib with RVR.  She has previously not been maintained on anticoagulation.  EKG from 2016 showed sinus rhythm.  She has reported regular cardiac testing through her PCP's office including echocardiogram.  We do not have these available for review.   She was admitted to the hospital in 06/2022 with A-fib with RVR, syncopal episode, and sepsis secondary to UTI.  On the morning of 07/03/2022, she got up to have a BM and was straining.  This was associated with dizziness and sweating.  She got up to lay on the bed, rolled over, and suffered a syncopal episode.  This was not witnessed, however her husband did hear the episode.  She came to on the floor.  EMS was called where she was found to be in A-fib with RVR.  She denied any preceding chest pain or dyspnea.  She did note ongoing dizziness and unsteadiness.  In the ED, she had greater than 10 episodes of nonbilious, nonbloody emesis.  Presenting rhythm of A-fib with RVR in the 140s to 150s bpm.  Telemetry showed several episodes of prolonged R-R intervals of 4.7 to 4.8 seconds in the ED.  It was unclear if these episodes corresponded with her emesis.  High-sensitivity troponin negative x2.  Head CT showed no acute intracranial abnormality with low attenuation of the periventricular white matter presumed to be chronic microvascular ischemic changes.  MRI of the brain showed no evidence of recent infarction, hemorrhage, or mass with chronic microvascular ischemic  changes.  Following improvement of emesis, she had no further pauses/prolonged R-R intervals on telemetry.  She was placed on a diltiazem  drip with spontaneous conversion to sinus rhythm.  Echo was ordered, though unable to be completed due to no echo tech available.  She was discharged on low-dose Toprol -XL and apixaban .  She was seen in hospital follow-up on 07/07/2022 and was doing well from a cardiac perspective, without symptoms of angina or decompensation.  She did note some weakness.  She was maintaining sinus rhythm.  Subsequent echo demonstrated an EF of 60 to 65%, no regional wall motion abnormalities, grade 1 diastolic dysfunction, normal RV systolic function, ventricular cavity size, and PASP, mild mitral regurgitation, and an estimated right atrial pressure of 3 mmHg.  Outpatient cardiac monitoring showed a predominant rhythm of sinus with an average rate of 60 bpm (range 43 to 156 bpm), 5 episodes of SVT with the fastest and longest interval lasting 10 beats with a maximum rate of 156 bpm, and rare atrial/ventricular ectopy.  Triggered events corresponded with normal sinus rhythm.     She was seen in the ED in 09/2022 with jaw pain and exertional dyspnea.  EKG nonacute.  High-sensitivity troponin negative x 2.  Chest x-ray showed no active disease.  She was subsequently found to have a deep cavity by her dentist.   Carotid artery ultrasound performed in 11/2022 through PCP's office showed 50 to 69% left ICA stenosis with no evidence of disease along the right ICA.  Echo performed at that time, through PCPs office, showed an EF greater than 55% with trace mitral valve regurgitation.   She was seen in the office in 02/2023 and noted stable longstanding exertional dyspnea and fatigue and reported she was more symptomatic following nights that she did not sleep well.  Lexiscan  MPI on 03/12/2023 showed no evidence of significant ischemia with an EF of 51% and was overall low risk.  CT attenuation corrected  images showed mild aortic atherosclerosis.  She was last seen in the office in 05/2023 and was doing well from a cardiac perspective, without symptoms of angina or cardiac decompensation.  She noted difficulty with sleeping and folic this was contributing to her elevated BP, with follow-up blood pressures noted to be overall reasonably controlled.  Since we last saw her, she has undergone total hip arthroplasty in 02/2024 without cardiac complication.  She comes in doing well from a cardiac perspective and is without symptoms of angina or cardiac decompensation.  No palpitations, dizziness, presyncope, or syncope.  She is more active now following right total hip arthroplasty and is walking on a regular basis.  She estimates that she has gained approximately 15 pounds in the postoperative setting due to inactivity while recovering.  She also reports continued difficulty sleeping.  Blood pressures at home are in the 1 49-1 60s range with most readings on the higher end of that scale.  She is now making an active effort to monitor her sodium intake as well.  No falls or symptoms concerning for bleeding.   Labs independently reviewed: 02/2024 - potassium 4.2, BUN 14, serum creatinine 0.66, Hgb 11.8, PLT 155, A1c 5.4 11/2023 - TC 187, TG 165, HDL 67, LDL 92, albumin 4.5, AST/ALT normal 06/2022 - TSH normal  Past Medical History:  Diagnosis Date   Allergy ?   Years ago and one recently.   Anxiety ?   Off and on for years   Arthritis    Atrial fibrillation with RVR (HCC) 07/03/2022   Blood transfusion without reported diagnosis Many years ago   Following an operation   Calculus of bile duct without cholecystitis with obstruction    Cataract    Cholecystitis with cholelithiasis 07/24/2015   Complication of anesthesia    during right knee repalcement, heart stopped   GERD (gastroesophageal reflux disease)    Hyperlipidemia    Hypertension    Hypokalemia    Nausea & vomiting    PAF (paroxysmal  atrial fibrillation) (HCC)    Pancreatitis    Pre-diabetes    Sepsis (HCC)    Substance abuse (HCC) ?   About 40 or so years ago   UTI (urinary tract infection)     Past Surgical History:  Procedure Laterality Date   ABDOMINAL HYSTERECTOMY     partial   APPENDECTOMY N/A    CHOLECYSTECTOMY N/A 07/24/2015   Procedure: LAPAROSCOPIC CHOLECYSTECTOMY CONVERTED TO OPEN CHOLECYSTECTOMY ;  Surgeon: Louanne KANDICE Muse, MD;  Location: [facility] ORS;  Service: General;  Laterality: N/A;   ERCP N/A 12/30/2021   Procedure: ENDOSCOPIC RETROGRADE CHOLANGIOPANCREATOGRAPHY (ERCP);  Surgeon: Jinny Carmine, MD;  Location: [facility];  Service: Endoscopy;  Laterality: N/A;   EXPLORATORY LAPAROTOMY     JOINT REPLACEMENT Bilateral 2009, 2010   knee   PARATHYROIDECTOMY Right 06/10/2011   growth/ Dr Blair   PARTIAL HYSTERECTOMY     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   Procedure: ARTHROPLASTY, HIP, TOTAL, ANTERIOR APPROACH;  Surgeon: Vernetta Lonni GRADE, MD;  Location: [facility] ORS;  Service: Orthopedics;  Laterality: Right;    Current Medications: Current Meds  Medication Sig   acetaminophen  (TYLENOL ) 325 MG tablet Take 325 mg by mouth every 8 (eight) hours as needed for moderate pain (pain score 4-6).   apixaban  (ELIQUIS ) 5 MG TABS tablet TAKE 1 TABLET(5 MG) BY MOUTH TWICE DAILY   denosumab  (PROLIA ) 60 MG/ML SOSY injection Inject 60 mg into the skin every 6 (six) months. Most recent injection 03/15/2023   Glucosamine Sulfate 500 MG CAPS Take by mouth as needed.   ibuprofen (ADVIL) 200 MG tablet Take 200 mg by mouth every 8 (eight) hours as needed for moderate pain (pain score 4-6) or mild pain (pain score 1-3). Advil   lovastatin  (MEVACOR ) 40 MG tablet TAKE 1 TABLET(40 MG) BY MOUTH IN THE MORNING   metoprolol  succinate (TOPROL -XL) 25 MG 24 hr tablet TAKE 1/2 TABLET(12.5 MG) BY MOUTH DAILY   Misc Natural Products (GLUCOSAMINE CHOND MSM FORMULA PO) Take 1 tablet by mouth daily as needed (Patient  Prefrence).   Vitamin D , Ergocalciferol , (DRISDOL ) 1.25 MG (50000 UNIT) CAPS capsule Take 1 capsule (50,000 Units total) by mouth every 7 (seven) days.   Zinc 10 MG LOZG Use as directed 1 each in the mouth or throat as needed (cold symptoms).   [DISCONTINUED] lisinopril  (ZESTRIL ) 20 MG tablet Take 1 tablet (20 mg total) by mouth daily.    Allergies:   Levofloxacin, Ampicillin, Celebrex [celecoxib], Nexium [esomeprazole magnesium], Ranitidine, Strawberry (diagnostic), and Zyrtec [cetirizine]   Social History   Socioeconomic History   Marital status: Married    Spouse name: Not on file   Number of children: Not on file   Years of education: Not on file   Highest education level: 12th grade  Occupational History   Not on file  Tobacco Use   Smoking status: Former    Current packs/day: 0.00    Types: Cigarettes    Start date: 11/10/1963    Quit date: 11/09/1968    Years since quitting: 55.5   Smokeless tobacco: Never   Tobacco comments:    Quit smoking about 35 years ago.  Vaping Use   Vaping status: Never Used  Substance and Sexual Activity   Alcohol use: No   Drug use: No   Sexual activity: Yes    Birth control/protection: None    Comment: I'm 82. Don't need. Also had a partial hysterectomy many yea  Other Topics Concern   Not on file  Social History Narrative   Not on file   Social Drivers of Health   Financial Resource Strain: Medium Risk (12/02/2023)   Overall Financial Resource Strain (CARDIA)    Difficulty of Paying Living Expenses: Somewhat hard  Food Insecurity: No Food Insecurity (02/18/2024)   Hunger Vital Sign    Worried About Running Out of Food in the Last Year: Never true    Ran Out of Food in the Last Year: Never true  Transportation Needs: No Transportation Needs (02/18/2024)   PRAPARE - Administrator, Civil Service (Medical): No    Lack of Transportation (Non-Medical): No  Physical Activity: Unknown (12/02/2023)   Exercise Vital Sign    Days  of Exercise per Week: 0 days    Minutes of Exercise per Session: Not on file  Stress: Stress Concern Present (12/02/2023)   [employer] of Occupational Health - Occupational Stress Questionnaire    Feeling of Stress : To some extent  Social Connections: Socially Integrated (02/18/2024)   Social Connection and Isolation  Panel    Frequency of Communication with Friends and Family: More than three times a week    Frequency of Social Gatherings with Friends and Family: Once a week    Attends Religious Services: More than 4 times per year    Active Member of [employer] or Organizations: Yes    Attends Engineer, structural: More than 4 times per year    Marital Status: Married     Family History:  The patient's family history includes Arthritis in her father and mother; Breast cancer (age of onset: 65) in her sister; Cancer in her sister; Depression in her mother; Diabetes in her father; Hearing loss in her father; Heart disease in her father; Hypertension in her father; Parkinson's disease in her mother; Stroke in her father.  ROS:   12-point review of systems is negative unless otherwise noted in the HPI.   EKGs/Labs/Other Studies Reviewed:    Studies reviewed were summarized above. The additional studies were reviewed today:  Zio patch 07/2022: Patient had a min HR of 43 bpm, max HR of 156 bpm, and avg HR of 60 bpm. Predominant underlying rhythm was Sinus Rhythm. 5 Supraventricular Tachycardia runs occurred, the run with the fastest interval lasting 10 beats with a max rate of 156 bpm (avg 107  bpm); the run with the fastest interval was also the longest. Isolated SVEs were rare (<1.0%), SVE Couplets were rare (<1.0%), and SVE Triplets were rare (<1.0%). Isolated VEs were rare (<1.0%), and no VE Couplets or VE Triplets were present.  __________   2D echo 08/28/2022: 1. Left ventricular ejection fraction, by estimation, is 60 to 65%. The  left ventricle has normal function. The  left ventricle has no regional  wall motion abnormalities. Left ventricular diastolic parameters are  consistent with Grade I diastolic  dysfunction (impaired relaxation). The average left ventricular global  longitudinal strain is -22.2 %.   2. Right ventricular systolic function is normal. The right ventricular  size is normal. There is normal pulmonary artery systolic pressure. The  estimated right ventricular systolic pressure is 27.1 mmHg.   3. The mitral valve is normal in structure. Mild mitral valve  regurgitation. No evidence of mitral stenosis.   4. The aortic valve was not well visualized. Aortic valve regurgitation  is not visualized. No aortic stenosis is present.   5. The inferior vena cava is normal in size with greater than 50%  respiratory variability, suggesting right atrial pressure of 3 mmHg. __________   Lexiscan  MPI 03/12/2023: Pharmacological myocardial perfusion imaging study with no significant ischemia Normal wall motion, EF estimated at 51% No EKG changes concerning for ischemia at peak stress or in recovery. CT attenuation correction images with mild aortic atherosclerosis Low risk scan   EKG:  EKG is ordered today.  The EKG ordered today demonstrates sinus bradycardia, 57 bpm, LVH with early repolarization abnormality  Recent Labs: 12/06/2023: ALT 15 02/19/2024: BUN 14; Creatinine, Ser 0.66; Hemoglobin 11.8; Platelets 155; Potassium 4.2; Sodium 138  Recent Lipid Panel    Component Value Date/Time   CHOL 187 12/06/2023 1034   TRIG 165 (H) 12/06/2023 1034   HDL 67 12/06/2023 1034   CHOLHDL 2.8 12/06/2023 1034   LDLCALC 92 12/06/2023 1034    PHYSICAL EXAM:    VS:  BP (!) 160/80 (BP Location: Left Arm, Patient Position: Sitting)   Pulse (!) 57   Ht 5' (1.524 m)   Wt 160 lb 3.2 oz (72.7 kg)   SpO2 98%  BMI 31.29 kg/m   BMI: Body mass index is 31.29 kg/m.  Physical Exam Vitals reviewed.  Constitutional:      Appearance: She is well-developed.   HENT:     Head: Normocephalic and atraumatic.  Eyes:     General:        Right eye: No discharge.        Left eye: No discharge.  Cardiovascular:     Rate and Rhythm: Regular rhythm. Bradycardia present.     Pulses:          Carotid pulses are  on the left side with bruit.    Heart sounds: Normal heart sounds, S1 normal and S2 normal. Heart sounds not distant. No midsystolic click and no opening snap. No murmur heard.    No friction rub.  Pulmonary:     Effort: Pulmonary effort is normal. No respiratory distress.     Breath sounds: Normal breath sounds. No decreased breath sounds, wheezing, rhonchi or rales.  Chest:     Chest wall: No tenderness.  Musculoskeletal:     Cervical back: Normal range of motion.  Skin:    General: Skin is warm and dry.     Nails: There is no clubbing.  Neurological:     Mental Status: She is alert and oriented to person, place, and time.  Psychiatric:        Speech: Speech normal.        Behavior: Behavior normal.        Thought Content: Thought content normal.        Judgment: Judgment normal.     Wt Readings from Last 3 Encounters:  05/16/24 160 lb 3.2 oz (72.7 kg)  04/12/24 159 lb 3.2 oz (72.2 kg)  02/18/24 156 lb (70.8 kg)     ASSESSMENT & PLAN:   PAF: Maintaining sinus rhythm with a bradycardic rate and is asymptomatic.  Continue Toprol -XL 12.5 mg daily.  CHA2DS2-VASc at least 4 (HTN, age x 2, sex category).  She remains on apixaban  5 mg twice daily and does not meet reduced dosing criteria.  No falls or symptoms concerning for bleeding.  Recent labs stable.  HTN: Blood pressure remains elevated in the office and has been trending on the high side at home.  Possibly in the setting of insomnia.  Titrate lisinopril  to 20 mg twice daily.  Continue Toprol -XL 12.5 mg daily.  Bradycardia precludes titration of beta-blocker.  Low-sodium diet recommended.  PSVT: Quiescent.  On metoprolol  as above.  Aortic atherosclerosis/HLD: LDL 92 in 11/2023  with target LDL less than 70.  Remains on lovastatin .  Followed by PCP.  History of syncope: No further episodes.  Felt to be vasovagal in the setting of straining for BM.  No evidence of high-grade AV block or prolonged pauses on outpatient cardiac monitoring.  Echo without significant structural abnormalities.  Lexiscan  MPI without evidence of high risk ischemia.  Carotid artery stenosis: 50 to 69% left ICA stenosis in 11/2022.  Apixaban  and lieu of aspirin  given underlying A-fib and statin as outlined above.  Schedule carotid artery ultrasound.     Disposition: F/u with Dr. Gollan or an APP in 6 months.   Medication Adjustments/Labs and Tests Ordered: Current medicines are reviewed at length with the patient today.  Concerns regarding medicines are outlined above. Medication changes, Labs and Tests ordered today are summarized above and listed in the Patient Instructions accessible in Encounters.   Signed, Bernardino Bring, PA-C 05/16/2024 5:11 PM  [facility] [address] [place], [place] [address] [phone]

## 2024-05-16 ENCOUNTER — Encounter: Payer: Self-pay | Admitting: Physician Assistant

## 2024-05-16 ENCOUNTER — Ambulatory Visit: Attending: Physician Assistant | Admitting: Physician Assistant

## 2024-05-16 VITALS — BP 160/80 | HR 57 | Ht 60.0 in | Wt 160.2 lb

## 2024-05-16 DIAGNOSIS — Z87898 Personal history of other specified conditions: Secondary | ICD-10-CM | POA: Diagnosis not present

## 2024-05-16 DIAGNOSIS — I10 Essential (primary) hypertension: Secondary | ICD-10-CM

## 2024-05-16 DIAGNOSIS — I700 Atherosclerosis of aorta: Secondary | ICD-10-CM

## 2024-05-16 DIAGNOSIS — I6522 Occlusion and stenosis of left carotid artery: Secondary | ICD-10-CM

## 2024-05-16 DIAGNOSIS — I480 Paroxysmal atrial fibrillation: Secondary | ICD-10-CM | POA: Diagnosis not present

## 2024-05-16 DIAGNOSIS — E785 Hyperlipidemia, unspecified: Secondary | ICD-10-CM

## 2024-05-16 DIAGNOSIS — I471 Supraventricular tachycardia, unspecified: Secondary | ICD-10-CM | POA: Diagnosis not present

## 2024-05-16 DIAGNOSIS — R55 Syncope and collapse: Secondary | ICD-10-CM

## 2024-05-16 MED ORDER — LISINOPRIL 20 MG PO TABS: 20.0000 mg | ORAL_TABLET | Freq: Two times a day (BID) | ORAL | 3 refills | Status: DC

## 2024-05-16 NOTE — Patient Instructions (Signed)
 Medication Instructions:  Your physician recommends the following medication changes.  INCREASE: Lisinopril  20 mg twice daily   *If you need a refill on your cardiac medications before your next appointment, please call your pharmacy*  Lab Work: None ordered at this time    Follow-Up: At [facility], you and your health needs are our priority.  As part of our continuing mission to provide you with exceptional heart care, our providers are all part of one team.  This team includes your primary Cardiologist (physician) and Advanced Practice Providers or APPs (Physician Assistants and Nurse Practitioners) who all work together to provide you with the care you need, when you need it.  Your next appointment:   6 month(s)  Provider:   You may see Timothy Gollan, MD or Lesley Maffucci, PA-C

## 2024-05-31 DIAGNOSIS — M8580 Other specified disorders of bone density and structure, unspecified site: Secondary | ICD-10-CM | POA: Diagnosis not present

## 2024-06-08 ENCOUNTER — Other Ambulatory Visit: Payer: Self-pay | Admitting: Physician Assistant

## 2024-06-13 ENCOUNTER — Encounter: Payer: Self-pay | Admitting: Family Medicine

## 2024-06-13 ENCOUNTER — Ambulatory Visit (INDEPENDENT_AMBULATORY_CARE_PROVIDER_SITE_OTHER): Payer: Medicare HMO

## 2024-06-13 ENCOUNTER — Ambulatory Visit: Payer: Self-pay | Admitting: Family Medicine

## 2024-06-13 VITALS — BP 142/63 | HR 58 | Temp 98.6°F | Ht 61.0 in | Wt 160.6 lb

## 2024-06-13 VITALS — BP 170/80 | Ht 60.0 in | Wt 160.0 lb

## 2024-06-13 DIAGNOSIS — Z0000 Encounter for general adult medical examination without abnormal findings: Secondary | ICD-10-CM

## 2024-06-13 DIAGNOSIS — E119 Type 2 diabetes mellitus without complications: Secondary | ICD-10-CM

## 2024-06-13 DIAGNOSIS — E785 Hyperlipidemia, unspecified: Secondary | ICD-10-CM | POA: Diagnosis not present

## 2024-06-13 DIAGNOSIS — L918 Other hypertrophic disorders of the skin: Secondary | ICD-10-CM

## 2024-06-13 DIAGNOSIS — E559 Vitamin D deficiency, unspecified: Secondary | ICD-10-CM

## 2024-06-13 DIAGNOSIS — I10 Essential (primary) hypertension: Secondary | ICD-10-CM | POA: Diagnosis not present

## 2024-06-13 DIAGNOSIS — Z96641 Presence of right artificial hip joint: Secondary | ICD-10-CM

## 2024-06-13 DIAGNOSIS — J309 Allergic rhinitis, unspecified: Secondary | ICD-10-CM

## 2024-06-13 DIAGNOSIS — F5104 Psychophysiologic insomnia: Secondary | ICD-10-CM

## 2024-06-13 DIAGNOSIS — Z0001 Encounter for general adult medical examination with abnormal findings: Secondary | ICD-10-CM | POA: Diagnosis not present

## 2024-06-13 DIAGNOSIS — M1612 Unilateral primary osteoarthritis, left hip: Secondary | ICD-10-CM

## 2024-06-13 NOTE — Patient Instructions (Signed)
 Ms. Charlene Norris , Thank you for taking time out of your busy schedule to complete your Annual Wellness Visit with me. I enjoyed our conversation and look forward to speaking with you again next year. I, as well as your care team,  appreciate your ongoing commitment to your health goals. Please review the following plan we discussed and let me know if I can assist you in the future.   Follow up Visits: 06/19/25 @ 8:50 AM IN PERSON We will see or speak with you next year for your Next Medicare AWV with our clinical staff Have you seen your provider in the last 6 months (3 months if uncontrolled diabetes)? Yes  Clinician Recommendations:  Aim for 30 minutes of exercise or brisk walking, 6-8 glasses of water, and 5 servings of fruits and vegetables each day. TAKE CARE!      This is a list of the screenings recommended for you:  Health Maintenance  Topic Date Due   Eye exam for diabetics  Never done   Pneumococcal Vaccine for age over 24 (2 of 2 - PCV) 07/11/2015   Zoster (Shingles) Vaccine (2 of 2) 07/11/2018   Mammogram  12/25/2022   Flu Shot  06/09/2024   COVID-19 Vaccine (7 - 2024-25 season) 07/10/2024*   Hemoglobin A1C  08/12/2024   DEXA scan (bone density measurement)  11/04/2024   Yearly kidney health urinalysis for diabetes  12/05/2024   Complete foot exam   12/05/2024   Yearly kidney function blood test for diabetes  02/18/2025   Medicare Annual Wellness Visit  06/13/2025   DTaP/Tdap/Td vaccine (2 - Tdap) 07/20/2026   Hepatitis B Vaccine  Aged Out   HPV Vaccine  Aged Out   Meningitis B Vaccine  Aged Out  *Topic was postponed. The date shown is not the original due date.    Advanced directives: (ACP Link)Information on Advanced Care Planning can be found at [place]  Secretary of [facility] Advance Health Care Directives Advance Health Care Directives. [email or web address]  Advance Care Planning is important because it:  [x]  Makes sure you receive the medical care that is consistent with your  values, goals, and preferences  [x]  It provides guidance to your family and loved ones and reduces their decisional burden about whether or not they are making the right decisions based on your wishes.  Follow the link provided in your after visit summary or read over the paperwork we have mailed to you to help you started getting your Advance Directives in place. If you need assistance in completing these, please reach out to us  so that we can help you!

## 2024-06-13 NOTE — Patient Instructions (Signed)
 Recommended vaccines:  - Shingrix (shingles), 2nd dose  - Prevnar-20 or -21 (for pneumonia)

## 2024-06-13 NOTE — Progress Notes (Signed)
 Complete physical exam   Patient: Charlene Norris   DOB: Oct 09, 1941   83 y.o. Female  MRN: [id number] Visit Date: 06/13/2024  Today's healthcare provider: LAURAINE LOISE BUOY, DO   Chief Complaint  Patient presents with   Annual Exam    Diet -    Subjective    Charlene Norris is a 83 y.o. female who presents today for a complete physical exam.  She reports consuming a general diet. She is walking daily for an hour. She generally feels fairly well. She reports sleeping poorly. She does not have additional problems to discuss today.   HPI HPI     Annual Exam    Additional comments: Diet -       Last edited by Terrel Powell CROME, CMA on 06/13/2024  8:49 AM.      Charlene Norris is an 83 year old female with hypertension who presents for a follow-up visit.  She has been monitoring her blood pressure at home, with readings consistently around 165 mmHg, though there was one instance of it dropping to 129 mmHg without a clear reason. Previously, her blood pressure was mostly in the 130s to 140s range, but recently had a reading of 160 mmHg. She is currently taking lisinopril  twice a day. No chest pain, shortness of breath, dizziness, or lightheadedness.  She follows a gluten-free diet and has been trying to lose weight after gaining some due to limited mobility. She has been walking for an hour daily, often in shopping malls to avoid the heat. This increased activity might have slightly improved her erratic sleep pattern, although she still wakes up frequently during the night.  She underwent a right hip replacement 02/18/2024 and has completed her rehabilitation. She no longer requires a cane and has a follow-up appointment scheduled in September. She is considering a left hip replacement next year due to insurance constraints and a recent x-ray indicating deterioration in her left hip.  She experiences sinus headaches, particularly on the left side, which she attributes to seasonal  changes and dampness. She has a history of pollen and dust allergies and uses a natural drop under the tongue for relief.  She plans to get her shingles and pneumonia vaccinations and is waiting for the new flu vaccine to be available in September or October. She reports having skin tags that have reduced in size and are located under her breast.     Past Medical History:  Diagnosis Date   Allergy ?   Years ago and one recently.   Anxiety ?   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 or so years ago   UTI (urinary tract infection)    Past Surgical History:  Procedure Laterality Date   ABDOMINAL HYSTERECTOMY     partial   APPENDECTOMY N/A  CHOLECYSTECTOMY N/A 07/24/2015   Procedure: LAPAROSCOPIC CHOLECYSTECTOMY CONVERTED TO OPEN CHOLECYSTECTOMY ;  Surgeon: Louanne KANDICE Muse, MD;  Location: [facility] ORS;  Service: General;  Laterality: N/A;   ERCP N/A 12/30/2021   Procedure: ENDOSCOPIC RETROGRADE CHOLANGIOPANCREATOGRAPHY (ERCP);  Surgeon: Jinny Carmine, MD;  Location: [facility] ENDOSCOPY;  Service: Endoscopy;  Laterality: N/A;   EXPLORATORY LAPAROTOMY     JOINT REPLACEMENT Bilateral 2009, 2010   knee   PARATHYROIDECTOMY Right 06/10/2011   growth/ Dr Blair   PARTIAL HYSTERECTOMY     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   Procedure: ARTHROPLASTY, HIP, TOTAL, ANTERIOR APPROACH;  Surgeon: Vernetta Lonni GRADE,  MD;  Location: [facility] ORS;  Service: Orthopedics;  Laterality: Right;   Social History   Socioeconomic History   Marital status: Married    Spouse name: Not on file   Number of children: Not on file   Years of education: Not on file   Highest education level: 12th grade  Occupational History   Not on file  Tobacco Use   Smoking status: Former    Current packs/day: 0.00    Types: Cigarettes    Start date: 11/10/1963    Quit date: 11/09/1968    Years since quitting: 55.6   Smokeless tobacco: Never   Tobacco comments:    Quit smoking about 35 years ago.  Vaping Use   Vaping status: Never Used  Substance and Sexual Activity   Alcohol use: No   Drug use: No   Sexual activity: Yes    Birth control/protection: None    Comment: I'm 82. Don't need. Also had a partial hysterectomy many yea  Other Topics Concern   Not on file  Social History Narrative   Not on file   Social Drivers of Health   Financial Resource Strain: Low Risk  (06/13/2024)   Overall Financial Resource Strain (CARDIA)    Difficulty of Paying Living Expenses: Not hard at all  Food Insecurity: No Food Insecurity (06/13/2024)   Hunger Vital Sign    Worried About Running Out of Food in the Last Year: Never true    Ran Out of Food in the Last Year: Never true  Transportation Needs: No Transportation Needs (06/13/2024)   PRAPARE - Administrator, Civil Service (Medical): No    Lack of Transportation (Non-Medical): No  Physical Activity: Sufficiently Active (06/13/2024)   Exercise Vital Sign    Days of Exercise per Week: 6 days    Minutes of Exercise per Session: 70 min  Stress: No Stress Concern Present (06/13/2024)   [employer] of Occupational Health - Occupational Stress Questionnaire    Feeling of Stress: Not at all  Social Connections: Moderately Integrated (06/13/2024)   Social Connection and Isolation Panel    Frequency of Communication with Friends and Family: Three times a week    Frequency of Social  Gatherings with Friends and Family: Twice a week    Attends Religious Services: More than 4 times per year    Active Member of [employer] or Organizations: No    Attends Banker Meetings: Never    Marital Status: Married  Catering manager Violence: Not At Risk (06/13/2024)   Humiliation, Afraid, Rape, and Kick questionnaire    Fear of Current or Ex-Partner: No    Emotionally Abused: No    Physically Abused: No    Sexually Abused: No   Family Status  Relation Name Status   Mother Arty Alberta Deceased   Father Dallas Alberta Deceased  Sister Montie Hoit (Not Specified)  No partnership data on file   Family History  Problem Relation Age of Onset   Arthritis Mother    Parkinson's disease Mother    Depression Mother    Diabetes Father    Hypertension Father    Stroke Father    Arthritis Father    Hearing loss Father    Heart disease Father    Breast cancer Sister 26       DCIS   Cancer Sister    Allergies  Allergen Reactions   Levofloxacin Shortness Of Breath    Chest pressure, head pressure   Ampicillin Itching   Celebrex [Celecoxib] Hives   Nexium [Esomeprazole Magnesium] Hives and Diarrhea   Ranitidine Itching   Strawberry (Diagnostic) Hives   Zyrtec [Cetirizine] Diarrhea    Feel ill    Patient Care Team: Donzella Lauraine SAILOR, DO as PCP - General (Family Medicine) Perla, Evalene PARAS, MD as PCP - Cardiology (Cardiology) Dellie Louanne MATSU, MD as Consulting Physician (General Surgery) Morayati, Shamil J, MD as Attending Physician (Endocrinology) [facility], [facility]   Medications: Outpatient Medications Prior to Visit  Medication Sig   acetaminophen  (TYLENOL ) 325 MG tablet Take 325 mg by mouth every 8 (eight) hours as needed for moderate pain (pain score 4-6).   apixaban  (ELIQUIS ) 5 MG TABS tablet TAKE 1 TABLET(5 MG) BY MOUTH TWICE DAILY   denosumab  (PROLIA ) 60 MG/ML SOSY injection Inject 60 mg into the skin every 6 (six) months. Most recent  injection 03/15/2023   Glucosamine Sulfate 500 MG CAPS Take by mouth as needed.   lisinopril  (ZESTRIL ) 20 MG tablet Take 1 tablet (20 mg total) by mouth in the morning and at bedtime.   lovastatin  (MEVACOR ) 40 MG tablet TAKE 1 TABLET(40 MG) BY MOUTH IN THE MORNING   metoprolol  succinate (TOPROL -XL) 25 MG 24 hr tablet TAKE 1/2 TABLET(12.5 MG) BY MOUTH DAILY   Vitamin D , Ergocalciferol , (DRISDOL ) 1.25 MG (50000 UNIT) CAPS capsule Take 1 capsule (50,000 Units total) by mouth every 7 (seven) days.   Zinc 10 MG LOZG Use as directed 1 each in the mouth or throat as needed (cold symptoms).   [DISCONTINUED] ibuprofen (ADVIL) 200 MG tablet Take 200 mg by mouth every 8 (eight) hours as needed for moderate pain (pain score 4-6) or mild pain (pain score 1-3). Advil   [DISCONTINUED] Misc Natural Products (GLUCOSAMINE CHOND MSM FORMULA PO) Take 1 tablet by mouth daily as needed (Patient Prefrence).   No facility-administered medications prior to visit.    Review of Systems  Constitutional:  Negative for chills, fatigue and fever.  HENT:  Negative for congestion, ear pain, rhinorrhea, sneezing and sore throat.   Eyes: Negative.  Negative for pain and redness.  Respiratory:  Negative for cough, shortness of breath and wheezing.   Cardiovascular:  Negative for chest pain and leg swelling.  Gastrointestinal:  Negative for abdominal pain, blood in stool, constipation, diarrhea and nausea.  Endocrine: Negative for polydipsia and polyphagia.  Genitourinary: Negative.  Negative for dysuria, flank pain, hematuria, pelvic pain, vaginal bleeding and vaginal discharge.  Musculoskeletal:  Negative for arthralgias, back pain, gait problem and joint swelling.  Skin:  Negative for rash.  Neurological: Negative.  Negative for dizziness, tremors, seizures, weakness, light-headedness, numbness and headaches.  Hematological:  Negative for adenopathy.  Psychiatric/Behavioral: Negative.  Negative for behavioral problems,  confusion and dysphoric mood. The patient is not nervous/anxious and is not hyperactive.       Objective  BP (!) 142/63 (BP Location: Left Arm, Patient Position: Sitting, Cuff Size: Large)   Pulse (!) 58   Temp 98.6 F (37 C) (Oral)   Ht 5' 1 (1.549 m)   Wt 160 lb 9.6 oz (72.8 kg)   SpO2 100%   BMI 30.35 kg/m    Physical Exam Vitals and nursing note reviewed.  Constitutional:      General: She is awake.     Appearance: Normal appearance.  HENT:     Head: Normocephalic and atraumatic.     Right Ear: Tympanic membrane, ear canal and external ear normal.     Left Ear: Tympanic membrane, ear canal and external ear normal.     Nose: Nose normal.     Mouth/Throat:     Mouth: Mucous membranes are moist.     Pharynx: Oropharynx is clear. No oropharyngeal exudate or posterior oropharyngeal erythema.  Eyes:     General: No scleral icterus.    Extraocular Movements: Extraocular movements intact.     Conjunctiva/sclera: Conjunctivae normal.     Pupils: Pupils are equal, round, and reactive to light.  Neck:     Thyroid : No thyromegaly or thyroid  tenderness.  Cardiovascular:     Rate and Rhythm: Normal rate and regular rhythm.     Pulses: Normal pulses.     Heart sounds: Normal heart sounds.  Pulmonary:     Effort: Pulmonary effort is normal. No tachypnea, bradypnea or respiratory distress.     Breath sounds: Normal breath sounds. No stridor. No wheezing, rhonchi or rales.  Abdominal:     General: Bowel sounds are normal. There is no distension.     Palpations: Abdomen is soft. There is no mass.     Tenderness: There is no abdominal tenderness. There is no guarding.     Hernia: No hernia is present.  Musculoskeletal:     Cervical back: Normal range of motion and neck supple.     Right lower leg: No edema.     Left lower leg: No edema.  Lymphadenopathy:     Cervical: No cervical adenopathy.  Skin:    General: Skin is warm and dry.  Neurological:     Mental Status: She is  alert and oriented to person, place, and time. Mental status is at baseline.  Psychiatric:        Mood and Affect: Mood normal.        Behavior: Behavior normal.      Last depression screening scores    06/13/2024    8:57 AM 04/12/2024    9:46 AM 12/06/2023    9:15 AM  PHQ 2/9 Scores  PHQ - 2 Score 0 0 5  PHQ- 9 Score 0 6 11   Last fall risk screening    06/13/2024    8:59 AM  Fall Risk   Falls in the past year? 0  Number falls in past yr: 0  Injury with Fall? 0  Risk for fall due to : No Fall Risks  Follow up Falls evaluation completed;Falls prevention discussed   Last Audit-C alcohol use screening    06/13/2024    8:56 AM  Alcohol Use Disorder Test (AUDIT)  1. How often do you have a drink containing alcohol? 0  2. How many drinks containing alcohol do you have on a typical day when you are drinking? 0  3. How often do you have six or more drinks on one occasion? 0  AUDIT-C Score 0   A score of  3 or more in women, and 4 or more in men indicates increased risk for alcohol abuse, EXCEPT if all of the points are from question 1   No results found for any visits on 06/13/24.  Assessment & Plan    Routine Health Maintenance and Physical Exam  Exercise Activities and Dietary recommendations  Goals      DIET - EAT MORE FRUITS AND VEGETABLES        Immunization History  Administered Date(s) Administered   Fluad Trivalent(High Dose 65+) 08/09/2023   Moderna Covid-19 Vaccine Bivalent Booster 76yrs & up 02/13/2021, 10/17/2021   Moderna Sars-Covid-2 Vaccination 02/13/2021   PFIZER(Purple Top)SARS-COV-2 Vaccination 12/01/2019, 12/19/2019, 08/24/2020   Pneumococcal Polysaccharide-23 07/10/2014   Td 07/20/2016   Zoster Recombinant(Shingrix) 05/16/2018    Health Maintenance  Topic Date Due   OPHTHALMOLOGY EXAM  Never done   Pneumococcal Vaccine: 50+ Years (2 of 2 - PCV) 07/11/2015   Zoster Vaccines- Shingrix (2 of 2) 07/11/2018   MAMMOGRAM  12/25/2022   COVID-19 Vaccine  (7 - 2024-25 season) 07/10/2024 (Originally 07/11/2023)   INFLUENZA VACCINE  02/06/2025 (Originally 06/09/2024)   HEMOGLOBIN A1C  08/12/2024   DEXA SCAN  11/04/2024   Diabetic kidney evaluation - Urine ACR  12/05/2024   FOOT EXAM  12/05/2024   Diabetic kidney evaluation - eGFR measurement  02/18/2025   Medicare Annual Wellness (AWV)  06/13/2025   DTaP/Tdap/Td (2 - Tdap) 07/20/2026   Hepatitis B Vaccines  Aged Out   HPV VACCINES  Aged Out   Meningococcal B Vaccine  Aged Out    Discussed health benefits of physical activity, and encouraged her to engage in regular exercise appropriate for her age and condition.   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 Physical exam overall unremarkable except as noted above. Routine lab work ordered as noted.  Plans for upcoming eye exam, mammogram, shingles and pneumonia vaccinations, and flu shot in the September or early October. - Schedule and attend eye exam. - Schedule and attend breast check. - Receive shingles and pneumonia vaccinations. - Plan to receive flu shot in September or October. - Encouraged continuation of healthy diet and regular exercise.  Hypertension Hypertension with home readings with systolics mostly in the 130s-140s, occasional spikes to 160s or decrease into 120s. Prefers minimal medication use. Increasing lisinopril  beyond current dose (20 mg  twice daily) is not recommended due to negligible clinical effect.  Caution also considered due to patient's age, potential risk for falls, and occasional dips in blood pressure. - Continue current lisinopril  dosage of 20 mg twice daily. - Monitor blood pressure regularly at home.  Hyperlipidemia Hyperlipidemia managed with lovastatin . Prefers minimal medication use.  - Continue lovastatin  daily. - Check lipid panel today.  Psychophysiologic insomnia Insomnia with erratic sleep patterns. Some improvement potentially related to increased physical activity. - Encouraged regular physical activity to potentially improve sleep patterns.  Left hip osteoarthritis Left hip  osteoarthritis with plans to discuss potential future surgery. Last x-ray indicated deterioration. - Discuss potential left hip replacement surgery with orthopedic surgeon during follow-up visit.  Status post right hip replacement Status post right hip replacement in 02/17/2024. Completed rehabilitation, no longer requires a cane, and no current hip pain reported. - Attend follow-up appointment with orthopedic surgeon in September.  Vitamin D  deficiency Chronic, longstanding, requiring persistent replacement with high-dose vitamin D .  Continue ergocalciferol  50,000 units weekly.  Allergic rhinitis Allergic rhinitis with sinus headaches, particularly during damp weather. Managed with natural drops taken sublingually. - Continue current management with natural drops. - Consider use of loratadine if natural drops are not sufficient  Skin tags Presence of skin tags along bra line under breasts, noted to be decreasing in size.    Return in about 3 months (around 09/13/2024) for Chronic f/u.     I discussed the assessment and treatment plan with the patient  The patient was provided an opportunity to ask questions and all were answered. The patient agreed with the plan and demonstrated an understanding of the instructions.    The patient was advised to call back or seek an in-person evaluation if the symptoms worsen or if the condition fails to improve as anticipated.    LAURAINE LOISE BUOY, DO  [facility] [phone] (phone) [phone] (fax)  [facility]

## 2024-06-13 NOTE — Progress Notes (Signed)
 Subjective:   Charlene Norris is a 83 y.o. who presents for a Medicare Wellness preventive visit.  As a reminder, Annual Wellness Visits don't include a physical exam, and some assessments may be limited, especially if this visit is performed virtually. We may recommend an in-person follow-up visit with your provider if needed.  Visit Complete: In person  Persons Participating in Visit: Patient.  AWV Questionnaire: No: Patient Medicare AWV questionnaire was not completed prior to this visit.  Cardiac Risk Factors include: advanced age (>20men, >78 women);hypertension;dyslipidemia;obesity (BMI >30kg/m2)     Objective:    Today's Vitals   06/13/24 0846  BP: (!) 170/80  Weight: 160 lb (72.6 kg)  Height: 5' (1.524 m)   Body mass index is 31.25 kg/m.     06/13/2024    8:58 AM 02/18/2024   12:10 PM 02/18/2024    6:46 AM 02/11/2024    8:56 AM 09/28/2022    7:21 AM 12/30/2021   10:34 AM 07/24/2015   11:17 AM  Advanced Directives  Does Patient Have a Medical Advance Directive? No Yes Yes Yes No Yes No   Type of Special educational needs teacher of [place];Living will Healthcare Power of [employer] Power of [employer] Power of Attorney   Does patient want to make changes to medical advance directive?  No - Patient declined No - Patient declined      Copy of Healthcare Power of Attorney in Chart?  No - copy requested No - copy requested No - copy requested     Would patient like information on creating a medical advance directive? No - Patient declined    No - Patient declined  No - patient declined information      Data saved with a previous flowsheet row definition    Current Medications (verified) Outpatient Encounter Medications as of 06/13/2024  Medication Sig   acetaminophen  (TYLENOL ) 325 MG tablet Take 325 mg by mouth every 8 (eight) hours as needed for moderate pain (pain score 4-6).   apixaban  (ELIQUIS ) 5 MG TABS tablet TAKE 1 TABLET(5 MG) BY MOUTH TWICE  DAILY   denosumab  (PROLIA ) 60 MG/ML SOSY injection Inject 60 mg into the skin every 6 (six) months. Most recent injection 03/15/2023   Glucosamine Sulfate 500 MG CAPS Take by mouth as needed.   lisinopril  (ZESTRIL ) 20 MG tablet Take 1 tablet (20 mg total) by mouth in the morning and at bedtime.   lovastatin  (MEVACOR ) 40 MG tablet TAKE 1 TABLET(40 MG) BY MOUTH IN THE MORNING   metoprolol  succinate (TOPROL -XL) 25 MG 24 hr tablet TAKE 1/2 TABLET(12.5 MG) BY MOUTH DAILY   Vitamin D , Ergocalciferol , (DRISDOL ) 1.25 MG (50000 UNIT) CAPS capsule Take 1 capsule (50,000 Units total) by mouth every 7 (seven) days.   Zinc 10 MG LOZG Use as directed 1 each in the mouth or throat as needed (cold symptoms).   ibuprofen (ADVIL) 200 MG tablet Take 200 mg by mouth every 8 (eight) hours as needed for moderate pain (pain score 4-6) or mild pain (pain score 1-3). Advil   [DISCONTINUED] Misc Natural Products (GLUCOSAMINE CHOND MSM FORMULA PO) Take 1 tablet by mouth daily as needed (Patient Prefrence).   No facility-administered encounter medications on file as of 06/13/2024.    Allergies (verified) Levofloxacin, Ampicillin, Celebrex [celecoxib], Nexium [esomeprazole magnesium], Ranitidine, Strawberry (diagnostic), and Zyrtec [cetirizine]   History: Past Medical History:  Diagnosis Date   Allergy ?   Years ago and one recently.   Anxiety ?  Off and on for years   Arthritis    Atrial fibrillation with RVR (HCC) 07/03/2022   Blood transfusion without reported diagnosis Many years ago   Following an operation   Calculus of bile duct without cholecystitis with obstruction    Cataract    Cholecystitis with cholelithiasis 07/24/2015   Complication of anesthesia    during right knee repalcement, heart stopped   GERD (gastroesophageal reflux disease)    Hyperlipidemia    Hypertension    Hypokalemia    Nausea & vomiting    PAF (paroxysmal atrial fibrillation) (HCC)    Pancreatitis    Pre-diabetes    Sepsis  (HCC)    Substance abuse (HCC) ?   About 40 or so years ago   UTI (urinary tract infection)    Past Surgical History:  Procedure Laterality Date   ABDOMINAL HYSTERECTOMY     partial   APPENDECTOMY N/A    CHOLECYSTECTOMY N/A 07/24/2015   Procedure: LAPAROSCOPIC CHOLECYSTECTOMY CONVERTED TO OPEN CHOLECYSTECTOMY ;  Surgeon: Louanne KANDICE Muse, MD;  Location: [facility] ORS;  Service: General;  Laterality: N/A;   ERCP N/A 12/30/2021   Procedure: ENDOSCOPIC RETROGRADE CHOLANGIOPANCREATOGRAPHY (ERCP);  Surgeon: Jinny Carmine, MD;  Location[facility] END[facility]COPY;  Service: Endoscopy;  Laterality: N/A;   EXPLORATORY LAPAROTOMY     JOINT REPLACEMENT Bilateral 2009, 2010   knee   PARATHYROIDECTOMY Right 06/10/2011   growth/ Dr Blair   PARTIAL HYSTERECTOMY     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   Procedure: ARTHROPLASTY, HIP, TOTAL, ANTERIOR APPROACH;  Surgeon: Vernetta Lonni GRADE, MD;  Location: [facility] ORS;  Service: Orthopedics;  Laterality: Right;   Family History  Problem Relation Age of Onset   Arthritis Mother    Parkinson's disease Mother    Depression Mother    Diabetes Father    Hypertension Father    Stroke Father    Arthritis Father    Hearing loss Father    Heart disease Father    Breast cancer Sister 68       DCIS   Cancer Sister    Social History   Socioeconomic History   Marital status: Married    Spouse name: Not on file   Number of children: Not on file   Years of education: Not on file   Highest education level: 12th grade  Occupational History   Not on file  Tobacco Use   Smoking status: Former    Current packs/day: 0.00    Types: Cigarettes    Start date: 11/10/1963    Quit date: 11/09/1968    Years since quitting: 55.6   Smokeless tobacco: Never   Tobacco comments:    Quit smoking about 35 years ago.  Vaping Use   Vaping status: Never Used  Substance and Sexual Activity   Alcohol use: No   Drug use: No   Sexual activity: Yes    Birth  control/protection: None    Comment: I'm 82. Don't need. Also had a partial hysterectomy many yea  Other Topics Concern   Not on file  Social History Narrative   Not on file   Social Drivers of Health   Financial Resource Strain: Low Risk  (06/13/2024)   Overall Financial Resource Strain (CARDIA)    Difficulty of Paying Living Expenses: Not hard at all  Food Insecurity: No Food Insecurity (06/13/2024)   Hunger Vital Sign    Worried About Running Out of Food in the Last Year: Never true    Ran Out of Food  in the Last Year: Never true  Transportation Needs: No Transportation Needs (06/13/2024)   PRAPARE - Administrator, Civil Service (Medical): No    Lack of Transportation (Non-Medical): No  Physical Activity: Sufficiently Active (06/13/2024)   Exercise Vital Sign    Days of Exercise per Week: 6 days    Minutes of Exercise per Session: 70 min  Stress: No Stress Concern Present (06/13/2024)   [employer] of Occupational Health - Occupational Stress Questionnaire    Feeling of Stress: Not at all  Social Connections: Moderately Integrated (06/13/2024)   Social Connection and Isolation Panel    Frequency of Communication with Friends and Family: Three times a week    Frequency of Social Gatherings with Friends and Family: Twice a week    Attends Religious Services: More than 4 times per year    Active Member of [employer] or Organizations: No    Attends Banker Meetings: Never    Marital Status: Married    Tobacco Counseling Counseling given: Not Answered Tobacco comments: Quit smoking about 35 years ago.    Clinical Intake:  Pre-visit preparation completed: Yes  Pain : No/denies pain     BMI - recorded: 30.35 Nutritional Status: BMI > 30  Obese Nutritional Risks: None Diabetes: No  Lab Results  Component Value Date   HGBA1C 5.4 02/11/2024   HGBA1C 5.3 12/06/2023   HGBA1C 5.4 05/31/2023     How often do you need to have someone help you when you  read instructions, pamphlets, or other written materials from your doctor or pharmacy?: 1 - Never  Interpreter Needed?: No  Information entered by :: JHONNIE DAS, LPN   Activities of Daily Living    06/13/2024    8:59 AM 06/12/2024    2:57 PM  In your present state of health, do you have any difficulty performing the following activities:  Hearing? 0 0  Vision? 0 0  Difficulty concentrating or making decisions? 0 0  Walking or climbing stairs? 1 0  Comment HIP STIFF/PAIN   Dressing or bathing? 0 0  Doing errands, shopping? 0 0  Preparing Food and eating ? N N  Using the Toilet? N N  In the past six months, have you accidently leaked urine? N N  Do you have problems with loss of bowel control? N N  Managing your Medications? N N  Managing your Finances? N N  Housekeeping or managing your Housekeeping? N N    Patient Care Team: Donzella Lauraine SAILOR, DO as PCP - General (Family Medicine) Perla Evalene PARAS, MD as PCP - Cardiology (Cardiology) Dellie Louanne MATSU, MD as Consulting Physician (General Surgery) Morayati, Shamil J, MD as Attending Physician (Endocrinology) [facility], [facility]  I have updated your Care Teams any recent Medical Services you may have received from other providers in the past year.     Assessment:   This is a routine wellness examination for Charlene Norris.  Hearing/Vision screen Hearing Screening - Comments:: NO AIDS Vision Screening - Comments:: WEARS GLASSES ALL DAY- DR.WOODARD- HAS APPT COMING UP    Goals Addressed             This Visit's Progress    DIET - EAT MORE FRUITS AND VEGETABLES         Depression Screen     06/13/2024    8:57 AM 04/12/2024    9:46 AM 12/06/2023    9:15 AM 08/09/2023    1:34 PM  PHQ  2/9 Scores  PHQ - 2 Score 0 0 5 0  PHQ- 9 Score 0 6 11 7     Fall Risk     06/13/2024    8:59 AM 06/12/2024    2:57 PM 12/06/2023    9:32 AM 08/09/2023    1:34 PM  Fall Risk   Falls in the past year? 0 0 0 0  Number falls in  past yr: 0  0 0  Injury with Fall? 0  0 0  Risk for fall due to : No Fall Risks     Follow up Falls evaluation completed;Falls prevention discussed       MEDICARE RISK AT HOME:  Medicare Risk at Home Any stairs in or around the home?: Yes If so, are there any without handrails?: Yes Home free of loose throw rugs in walkways, pet beds, electrical cords, etc?: Yes Adequate lighting in your home to reduce risk of falls?: Yes Life alert?: No Use of a cane, walker or w/c?: No Grab bars in the bathroom?: Yes Shower chair or bench in shower?: Yes Elevated toilet seat or a handicapped toilet?: No  TIMED UP AND GO:  Was the test performed?  Yes  Length of time to ambulate 10 feet: 4 sec Gait steady and fast without use of assistive device  Cognitive Function: 6CIT completed        06/13/2024    9:01 AM  6CIT Screen  What Year? 0 points  What month? 0 points  What time? 0 points  Count back from 20 0 points  Months in reverse 0 points  Repeat phrase 0 points  Total Score 0 points    Immunizations Immunization History  Administered Date(s) Administered   Fluad Trivalent(High Dose 65+) 08/09/2023   Moderna Covid-19 Vaccine Bivalent Booster 88yrs & up 02/13/2021, 10/17/2021   Moderna Sars-Covid-2 Vaccination 02/13/2021   PFIZER(Purple Top)SARS-COV-2 Vaccination 12/01/2019, 12/19/2019, 08/24/2020   Pneumococcal Polysaccharide-23 07/10/2014   Td 07/20/2016   Zoster Recombinant(Shingrix) 05/16/2018    Screening Tests Health Maintenance  Topic Date Due   OPHTHALMOLOGY EXAM  Never done   Pneumococcal Vaccine: 50+ Years (2 of 2 - PCV) 07/11/2015   Zoster Vaccines- Shingrix (2 of 2) 07/11/2018   MAMMOGRAM  12/25/2022   INFLUENZA VACCINE  06/09/2024   COVID-19 Vaccine (7 - 2024-25 season) 07/10/2024 (Originally 07/11/2023)   HEMOGLOBIN A1C  08/12/2024   DEXA SCAN  11/04/2024   Diabetic kidney evaluation - Urine ACR  12/05/2024   FOOT EXAM  12/05/2024   Diabetic kidney  evaluation - eGFR measurement  02/18/2025   Medicare Annual Wellness (AWV)  06/13/2025   DTaP/Tdap/Td (2 - Tdap) 07/20/2026   Hepatitis B Vaccines  Aged Out   HPV VACCINES  Aged Out   Meningococcal B Vaccine  Aged Out    Health Maintenance  Health Maintenance Due  Topic Date Due   OPHTHALMOLOGY EXAM  Never done   Pneumococcal Vaccine: 50+ Years (2 of 2 - PCV) 07/11/2015   Zoster Vaccines- Shingrix (2 of 2) 07/11/2018   MAMMOGRAM  12/25/2022   INFLUENZA VACCINE  06/09/2024   Health Maintenance Items Addressed: HAS APPT FOR MAMMOGRAM, AGED OUT OF COLONOSCOPY; UP TO DATE BDS; WILL BE GETTING PNA & 2ND SHINGRIX AT PHARMACY  Additional Screening:  Vision Screening: Recommended annual ophthalmology exams for early detection of glaucoma and other disorders of the eye. Would you like a referral to an eye doctor? No    Dental Screening: Recommended annual dental exams for proper  oral hygiene  Community Resource Referral / Chronic Care Management: CRR required this visit?  No   CCM required this visit?  No   Plan:    I have personally reviewed and noted the following in the patient's chart:   Medical and social history Use of alcohol, tobacco or illicit drugs  Current medications and supplements including opioid prescriptions. Patient is not currently taking opioid prescriptions. Functional ability and status Nutritional status Physical activity Advanced directives List of other physicians Hospitalizations, surgeries, and ER visits in previous 12 months Vitals Screenings to include cognitive, depression, and falls Referrals and appointments  In addition, I have reviewed and discussed with patient certain preventive protocols, quality metrics, and best practice recommendations. A written personalized care plan for preventive services as well as general preventive health recommendations were provided to patient.   Jhonnie GORMAN Das, LPN   11/12/7972   After Visit Summary: (In  Person-Declined) Patient declined AVS at this time.  Notes: Nothing significant to report at this time.

## 2024-06-14 LAB — LIPID PANEL
Chol/HDL Ratio: 2.6 ratio (ref 0.0–4.4)
Cholesterol, Total: 175 mg/dL (ref 100–199)
HDL: 68 mg/dL (ref >39)
LDL Chol Calc (NIH): 83 mg/dL (ref 0–99)
Triglycerides: 141 mg/dL (ref 0–149)
VLDL Cholesterol Cal: 24 mg/dL (ref 5–40)

## 2024-06-14 LAB — COMPREHENSIVE METABOLIC PANEL WITH GFR
ALT: 11 IU/L (ref 0–32)
AST: 18 IU/L (ref 0–40)
Albumin: 4.6 g/dL (ref 3.7–4.7)
Alkaline Phosphatase: 56 IU/L (ref 44–121)
BUN/Creatinine Ratio: 16 (ref 12–28)
BUN: 14 mg/dL (ref 8–27)
Bilirubin Total: 0.4 mg/dL (ref 0.0–1.2)
CO2: 23 mmol/L (ref 20–29)
Calcium: 10 mg/dL (ref 8.7–10.3)
Chloride: 103 mmol/L (ref 96–106)
Creatinine, Ser: 0.85 mg/dL (ref 0.57–1.00)
Globulin, Total: 2.3 g/dL (ref 1.5–4.5)
Glucose: 86 mg/dL (ref 70–99)
Potassium: 4.6 mmol/L (ref 3.5–5.2)
Sodium: 141 mmol/L (ref 134–144)
Total Protein: 6.9 g/dL (ref 6.0–8.5)
eGFR: 68 mL/min/1.73 (ref >59)

## 2024-06-14 LAB — MICROALBUMIN / CREATININE URINE RATIO
Creatinine, Urine: 219.9 mg/dL
Microalb/Creat Ratio: 6 mg/g{creat} (ref 0–29)
Microalbumin, Urine: 14.1 ug/mL

## 2024-06-14 LAB — HEMOGLOBIN A1C
Est. average glucose Bld gHb Est-mCnc: 105 mg/dL
Hgb A1c MFr Bld: 5.3 % (ref 4.8–5.6)

## 2024-06-20 ENCOUNTER — Ambulatory Visit: Payer: Self-pay | Admitting: Family Medicine

## 2024-06-21 ENCOUNTER — Ambulatory Visit: Admitting: Family Medicine

## 2024-06-28 ENCOUNTER — Ambulatory Visit
Admission: RE | Admit: 2024-06-28 | Discharge: 2024-06-28 | Disposition: A | Discharge status: Home / Self Care | Source: Ambulatory Visit | Attending: Family Medicine | Admitting: Family Medicine

## 2024-06-28 DIAGNOSIS — Z1231 Encounter for screening mammogram for malignant neoplasm of breast: Secondary | ICD-10-CM | POA: Diagnosis not present

## 2024-07-03 ENCOUNTER — Ambulatory Visit: Payer: Self-pay | Admitting: Family Medicine

## 2024-07-31 ENCOUNTER — Other Ambulatory Visit: Payer: Self-pay | Admitting: Family Medicine

## 2024-08-07 ENCOUNTER — Other Ambulatory Visit (INDEPENDENT_AMBULATORY_CARE_PROVIDER_SITE_OTHER): Payer: Self-pay

## 2024-08-07 ENCOUNTER — Ambulatory Visit: Admitting: Physician Assistant

## 2024-08-07 ENCOUNTER — Encounter: Payer: Self-pay | Admitting: Physician Assistant

## 2024-08-07 DIAGNOSIS — Z96641 Presence of right artificial hip joint: Secondary | ICD-10-CM

## 2024-08-07 NOTE — Progress Notes (Signed)
 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  She states she is doing overall well.  Denies any pain in the right hip whatsoever.  She states that her husband states that she has a hard time keeping up with her now.  She is starting to have a twinge of pain in the left hip.  Otherwise no complaints.  Review of systems: See HPI otherwise negative  Physical exam: General Well-developed well-nourished female ambulates without any assistive device nonantalgic gait. Bilateral hips: Good range of motion of both hips without significant pain.  Radiographs: AP pelvis: Status post right total hip arthroplasty with well-seated components.  No acute fracture or acute findings.  Left hip overall well-maintained.  No acute fractures left hip.  No bony abnormalities.  Impression: Status post right total hip arthroplasty  Plan: Will see her back at 1 year postop and obtain an AP pelvis at that point in time.  Questions were encouraged and answered at length today

## 2024-08-16 ENCOUNTER — Other Ambulatory Visit: Payer: Self-pay | Admitting: Cardiovascular Disease

## 2024-08-16 NOTE — Telephone Encounter (Signed)
 Prescription refill request for Eliquis  received. Indication:afib Last office visit:7/25 Scr:0.85  8/25 Age: 83 Weight:72.8  kg  Prescription refilled

## 2024-08-29 DIAGNOSIS — K080 Exfoliation of teeth due to systemic causes: Secondary | ICD-10-CM | POA: Diagnosis not present

## 2024-09-11 ENCOUNTER — Encounter: Payer: Self-pay | Admitting: Radiology

## 2024-09-14 ENCOUNTER — Ambulatory Visit: Admitting: Family Medicine

## 2024-09-14 ENCOUNTER — Encounter: Payer: Self-pay | Admitting: Family Medicine

## 2024-09-14 VITALS — BP 148/74 | HR 58 | Temp 98.0°F | Ht 60.0 in | Wt 163.0 lb

## 2024-09-14 DIAGNOSIS — E66811 Obesity, class 1: Secondary | ICD-10-CM

## 2024-09-14 DIAGNOSIS — I10 Essential (primary) hypertension: Secondary | ICD-10-CM | POA: Diagnosis not present

## 2024-09-14 DIAGNOSIS — F32A Depression, unspecified: Secondary | ICD-10-CM

## 2024-09-14 DIAGNOSIS — K5909 Other constipation: Secondary | ICD-10-CM | POA: Diagnosis not present

## 2024-09-14 DIAGNOSIS — E785 Hyperlipidemia, unspecified: Secondary | ICD-10-CM | POA: Diagnosis not present

## 2024-09-14 DIAGNOSIS — R0982 Postnasal drip: Secondary | ICD-10-CM

## 2024-09-14 MED ORDER — LOVASTATIN 40 MG PO TABS: 40.0000 mg | ORAL_TABLET | Freq: Every day | ORAL | 3 refills | Status: AC

## 2024-09-14 NOTE — Progress Notes (Signed)
 Established patient visit   Patient: Charlene Norris   DOB: 1940/11/14   83 y.o. Female  MRN: [id number] Visit Date: 09/14/2024  Today's healthcare provider: LAURAINE LOISE BUOY, DO   Chief Complaint  Patient presents with   Medical Management of Chronic Issues    Patient is here for a 3 month follow up, expresses no concerns.  States she got the pneumococcal vaccine at the drug store.  Diabetic Eye Exam- appointment next month   Subjective    HPI Charlene Norris is an 83 year old female with atrial fibrillation who presents for a routine follow-up visit.  She mentions increased fatigue and changes in activity level, attributing these to stress related to her husband's recent cancer diagnosis and treatment.  Her diet has been disrupted, and she acknowledges needing to get back on track. She continues to walk daily, primarily indoors due to weather conditions, and notes that her hip replacement has significantly improved her mobility. She mentions that her left hip may require replacement in the future, as her orthopedic surgeon indicated it might need to be done in a year or two.  She has stopped regularly checking her blood pressure at home but reports it was lower than previous readings, which were around 180 mmHg. She is currently on medication managed by Dr. Letha, which has helped reduce her blood pressure.  She experiences a dry throat and nasal drainage, which she manages with sugar-free peppermint candies. She reports a stuffy nose and occasional tenderness on the left side of her face, which she has not experienced before.  She reports occasional constipation, noting a recent episode of straining with bowel movements. She has not used laxatives like MiraLAX or Colace before, as she typically does not have issues with constipation.  She experiences mild shortness of breath with exertion but does not find it concerning. She acknowledges feeling a bit down due to her husband's  cancer diagnosis but attributes it to the stress of the situation.       Medications: Outpatient Medications Prior to Visit  Medication Sig   acetaminophen  (TYLENOL ) 325 MG tablet Take 325 mg by mouth every 8 (eight) hours as needed for moderate pain (pain score 4-6).   denosumab  (PROLIA ) 60 MG/ML SOSY injection Inject 60 mg into the skin every 6 (six) months. Most recent injection 03/15/2023   ELIQUIS  5 MG TABS tablet TAKE 1 TABLET(5 MG) BY MOUTH TWICE DAILY   Glucosamine Sulfate 500 MG CAPS Take by mouth as needed.   lisinopril  (ZESTRIL ) 20 MG tablet Take 1 tablet (20 mg total) by mouth in the morning and at bedtime.   metoprolol  succinate (TOPROL -XL) 25 MG 24 hr tablet TAKE 1/2 TABLET(12.5 MG) BY MOUTH DAILY   Vitamin D , Ergocalciferol , (DRISDOL ) 1.25 MG (50000 UNIT) CAPS capsule Take 1 capsule (50,000 Units total) by mouth every 7 (seven) days.   Zinc 10 MG LOZG Use as directed 1 each in the mouth or throat as needed (cold symptoms).   [DISCONTINUED] lovastatin  (MEVACOR ) 40 MG tablet TAKE 1 TABLET(40 MG) BY MOUTH IN THE MORNING   No facility-administered medications prior to visit.    Review of Systems  Constitutional:  Negative for appetite change, chills, fatigue and fever.  Respiratory:  Negative for chest tightness and shortness of breath.   Cardiovascular:  Negative for chest pain and palpitations.  Gastrointestinal:  Positive for constipation (mild this morning). Negative for abdominal pain, diarrhea, nausea and vomiting.  Neurological:  Negative for  dizziness and weakness.        Objective    BP (!) 148/74 (BP Location: Left Arm, Patient Position: Sitting, Cuff Size: Normal)   Pulse (!) 58   Temp 98 F (36.7 C) (Oral)   Ht 5' (1.524 m)   Wt 163 lb (73.9 kg)   SpO2 99%   BMI 31.83 kg/m     Physical Exam Constitutional:      Appearance: Normal appearance.  HENT:     Head: Normocephalic and atraumatic.  Eyes:     General: No scleral icterus.     Extraocular Movements: Extraocular movements intact.     Conjunctiva/sclera: Conjunctivae normal.  Cardiovascular:     Rate and Rhythm: Normal rate and regular rhythm.     Pulses: Normal pulses.     Heart sounds: Normal heart sounds.  Pulmonary:     Effort: Pulmonary effort is normal. No respiratory distress.     Breath sounds: Normal breath sounds.  Abdominal:     General: Bowel sounds are normal. There is no distension.     Palpations: Abdomen is soft. There is no mass.     Tenderness: There is abdominal tenderness (LUQ). There is no guarding.  Musculoskeletal:     Right lower leg: No edema.     Left lower leg: No edema.  Skin:    General: Skin is warm and dry.  Neurological:     Mental Status: She is alert and oriented to person, place, and time. Mental status is at baseline.  Psychiatric:        Mood and Affect: Mood normal.        Behavior: Behavior normal.      No results found for any visits on 09/14/24.  Assessment & Plan    Hyperlipidemia, unspecified hyperlipidemia type Assessment & Plan: Chronic, stable.  Refill lovastatin  today.  No changes.  Continue to monitor.  Orders: -     Lovastatin ; Take 1 tablet (40 mg total) by mouth at bedtime.  Dispense: 90 tablet; Refill: 3  Intermittent constipation  Postnasal drip  Primary hypertension  Obesity (BMI 30.0-34.9)  Mild depression      Intermittent constipation Intermittent constipation with recent straining and left-sided abdominal pain, likely due to constipation. - Recommended MiraLAX daily as needed. - Advised monitoring bowel movements and adjusting MiraLAX use. - Suggested Benefiber if constipation persists.  Postnasal drip Chronic dry throat and nasal drainage. - Continue sugar-free peppermint candies for relief.  Primary hypertension. Chronic, previously elevated blood pressure but now improved.  Decreased from 180s to a more acceptable range. - Encouraged home blood pressure monitoring. -  Advised on lifestyle modifications including diet and exercise.  Obesity (BMI 30.0-34.9) Weight gain post-hip replacement, current weight higher than pre-surgery. - Encouraged continuation of regular walking exercise. - Advised on dietary modifications for weight loss.  Mild depression Feeling down likely due to stressors, including husband's cancer diagnosis.  Patient feels she is managing well and is not interested in intervention at this time.  No acute concerns.  Continue to monitor  General Health Maintenance Discussed importance of vaccinations, including COVID booster and tetanus vaccine. - Recommended COVID booster vaccination. - Encouraged tetanus vaccination.    Return in about 3 months (around 12/15/2024) for Chronic f/u.      I discussed the assessment and treatment plan with the patient  The patient was provided an opportunity to ask questions and all were answered. The patient agreed with the plan and demonstrated an understanding of the  instructions.   The patient was advised to call back or seek an in-person evaluation if the symptoms worsen or if the condition fails to improve as anticipated.    LAURAINE LOISE BUOY, DO  [facility] [phone] (phone) [phone] (fax)  [facility]

## 2024-09-14 NOTE — Patient Instructions (Addendum)
 Recommend miralax - one capful daily as needed for constipation  Can consider Flonase nasal spray for congestion and nasal drainage - 1-2 sprays per nostril daily as needed.

## 2024-09-25 NOTE — Assessment & Plan Note (Signed)
 Chronic, stable.  Refill lovastatin  today.  No changes.  Continue to monitor.

## 2024-09-26 DIAGNOSIS — H1045 Other chronic allergic conjunctivitis: Secondary | ICD-10-CM | POA: Diagnosis not present

## 2024-09-26 DIAGNOSIS — H2513 Age-related nuclear cataract, bilateral: Secondary | ICD-10-CM | POA: Diagnosis not present

## 2024-09-26 DIAGNOSIS — E119 Type 2 diabetes mellitus without complications: Secondary | ICD-10-CM | POA: Diagnosis not present

## 2024-09-26 DIAGNOSIS — H43393 Other vitreous opacities, bilateral: Secondary | ICD-10-CM | POA: Diagnosis not present

## 2024-09-26 LAB — OPHTHALMOLOGY REPORT-SCANNED

## 2024-09-29 NOTE — Progress Notes (Signed)
 Charlene Norris                                          MRN: [id number]   09/29/2024   The [facility] Quality Team Specialist reviewed this patient medical record for the purposes of chart review for care gap closure. The following were reviewed: chart review for care gap closure-controlling blood pressure.    [facility] Quality Team

## 2024-10-04 ENCOUNTER — Ambulatory Visit: Admitting: Physician Assistant

## 2024-10-27 NOTE — Progress Notes (Signed)
 Charlene Norris                                          MRN: [id number]   10/27/2024   The [facility] Quality Team Specialist reviewed this patient medical record for the purposes of chart review for care gap closure. The following were reviewed: chart review for care gap closure-controlling blood pressure.    [facility] Quality Team

## 2024-11-08 NOTE — Progress Notes (Unsigned)
 "  Cardiology Office Note    Date:  11/08/2024   ID:  Charlene Norris, DOB January 23, 1941, MRN [id number]  PCP:  Donzella Lauraine SAILOR, DO  Cardiologist:  Evalene Lunger, MD  Electrophysiologist:  None   Chief Complaint: ***  History of Present Illness:   Charlene Norris is a 83 y.o. female with history of paroxysmal atrial fibrillation, pancreatitis, left-sided carotid artery stenosis, hiatal hernia, hypertension, aortic atherosclerosis, hyperlipidemia, prediabetes, and GERD who presents for***.    Remote EKG from 2010 showed atrial fibrillation with RVR. She was admitted in 06/2022 with atrial fibrillation with RVR, sycope, and sepsis secondary to UTI. Converted to sinus rhythm on diltiazem  drip. Echo with EF 60-65%, no RWMA, G1DD, mild MR. Outpatient heart monitor showed predominately sinus rhythm with 5 episodes of SVT up to 10 beats with rare ectopy.   Carotid artery ultrasound performed in 11/2022 through PCP's office showed 50 to 69% left ICA stenosis with no evidence of disease along the right ICA. Echo performed at that time, through PCPs office, showed an EF greater than 55% with trace mitral valve regurgitation.   She was seen in the office in 02/2023 and noted stable longstanding exertional dyspnea and fatigue and reported she was more symptomatic following nights that she did not sleep well. Lexiscan  MPI on 03/12/2023 showed no evidence of significant ischemia with an EF of 51% and was overall low risk. CT attenuation corrected images showed mild aortic atherosclerosis.    Patient was most recently seen in our office 05/2024 overall doing well from a cardiac perspective. Blood pressures at home were noted to be in the 140s-160s range. Lisinopril  was increased to 20 mg twice daily with low sodium diet recommended.   ***  Labs independently reviewed: 06/2024- BUN 14, Cr 0.85, sodium 141, potassium 4.6, normal LFTs, TC 175, TG 141, HDL 68, LDL 83 02/2024 Hgb 88.1, HCT 37.2, platelets  155  Objective   Past Medical History:  Diagnosis Date   Allergy ?   Years ago and one recently.   Anxiety ?   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 or so years ago   UTI (urinary tract infection)     Current Medications: Active Medications[1]  Allergies:   Levofloxacin, Ampicillin, Celebrex [celecoxib], Nexium [esomeprazole magnesium], Ranitidine, Strawberry (diagnostic), and Zyrtec [cetirizine]   Social History   Socioeconomic History   Marital status: Married    Spouse name: Not on file   Number of children: Not on file   Years of education: Not on file   Highest education level: 12th grade  Occupational History   Not on file  Tobacco Use   Smoking status: Former    Current packs/day: 0.00    Types: Cigarettes    Start date: 11/10/1963    Quit date: 11/09/1968  Years since quitting: 56.0   Smokeless tobacco: Never   Tobacco comments:    Quit smoking about 35 years ago.  Vaping Use   Vaping status: Never Used  Substance and Sexual Activity   Alcohol use: No   Drug use: No   Sexual activity: Yes    Birth control/protection: None    Comment: Im 82. Dont need. Also had a partial hysterectomy many yea  Other Topics Concern   Not on file  Social History Narrative   Not on file   Social Drivers of Health   Tobacco Use: Medium Risk (09/14/2024)   Patient History    Smoking Tobacco Use:  Former    Smokeless Tobacco Use: Never    Passive Exposure: Not on file  Financial Resource Strain: Low Risk (09/11/2024)   Overall Financial Resource Strain (CARDIA)    Difficulty of Paying Living Expenses: Not hard at all  Food Insecurity: No Food Insecurity (09/11/2024)   Epic    Worried About Programme Researcher, Broadcasting/film/video in the Last Year: Never true    Ran Out of Food in the Last Year: Never true  Transportation Needs: No Transportation Needs (09/11/2024)   Epic    Lack of Transportation (Medical): No    Lack of Transportation (Non-Medical): No  Physical Activity: Sufficiently Active (09/11/2024)   Exercise Vital Sign    Days of Exercise per Week: 5 days    Minutes of Exercise per Session: 60 min  Stress: No Stress Concern Present (09/11/2024)   [employer] of Occupational Health - Occupational Stress Questionnaire    Feeling of Stress: Not at all  Social Connections: Moderately Integrated (09/11/2024)   Social Connection and Isolation Panel    Frequency of Communication with Friends and Family: More than three times a week    Frequency of Social Gatherings with Friends and Family: Once a week    Attends Religious Services: More than 4 times per year    Active Member of Clubs or Organizations: No    Attends Banker Meetings: Not on file    Marital Status: Married  Depression (PHQ2-9): Medium Risk (09/14/2024)   Depression (PHQ2-9)    PHQ-2 Score: 7  Alcohol Screen: Low Risk (09/11/2024)   Alcohol Screen    Last Alcohol Screening Score (AUDIT): 0  Housing: Low Risk (09/11/2024)   Epic    Unable to Pay for Housing in the Last Year: No    Number of Times Moved in the Last Year: 0    Homeless in the Last Year: No  Utilities: Not At Risk (06/13/2024)   Epic    Threatened with loss of utilities: No  Health Literacy: Adequate Health Literacy (06/13/2024)   B1300 Health Literacy    Frequency of need for help with medical instructions: Never     Family History:  The patient's  family history includes Arthritis in her father and mother; Breast cancer (age of onset: 72) in her sister; Cancer in her sister; Depression in her mother; Diabetes in her father; Hearing loss in her father; Heart disease in her father; Hypertension in her father; Parkinson's disease in her mother; Stroke in her father.  ROS:   12-point review of systems is negative unless otherwise noted in the HPI.  EKGs/Other Studies Reviewed:    Studies reviewed were summarized above. The additional studies were reviewed today:  03/12/2023 Lexiscan  MPI  Pharmacological myocardial perfusion imaging study with no significant ischemia Normal wall motion, EF estimated at 51% No EKG changes  concerning for ischemia at peak stress or in recovery. CT attenuation correction images with mild aortic atherosclerosis Low risk scan  08/28/2022 2D echo  1. Left ventricular ejection fraction, by estimation, is 60 to 65%. The  left ventricle has normal function. The left ventricle has no regional  wall motion abnormalities. Left ventricular diastolic parameters are  consistent with Grade I diastolic  dysfunction (impaired relaxation). The average left ventricular global  longitudinal strain is -22.2 %.   2. Right ventricular systolic function is normal. The right ventricular  size is normal. There is normal pulmonary artery systolic pressure. The  estimated right ventricular systolic pressure is 27.1 mmHg.   3. The mitral valve is normal in structure. Mild mitral valve  regurgitation. No evidence of mitral stenosis.   4. The aortic valve was not well visualized. Aortic valve regurgitation  is not visualized. No aortic stenosis is present.   5. The inferior vena cava is normal in size with greater than 50%  respiratory variability, suggesting right atrial pressure of 3 mmHg.  EKG:  EKG personally reviewed by me today    PHYSICAL EXAM:    VS:  There were no vitals taken for this visit.  BMI: There is no height or  weight on file to calculate BMI.  GEN: Well nourished, well developed in no acute distress NECK: No JVD; No carotid bruits CARDIAC: ***RRR, no murmurs, rubs, gallops RESPIRATORY:  Clear to auscultation without rales, wheezing or rhonchi  ABDOMEN: Soft, non-tender, non-distended EXTREMITIES:  *** No edema; No deformity  Wt Readings from Last 3 Encounters:  09/14/24 163 lb (73.9 kg)  06/13/24 160 lb 9.6 oz (72.8 kg)  06/13/24 160 lb (72.6 kg)                  ASSESSMENT & PLAN:   Paroxysmal atrial fibrillation   Hypertension   PSVT   Aortic atherosclerosis Hyperlipidemia   Hx syncope   Carotid artery stenosis     {Are you ordering a CV Procedure (e.g. stress test, cath, DCCV, TEE, etc)?   Press F2        :[id number]}   Disposition: F/u with Dr. Gollan or an APP in ***.   Medication Adjustments/Labs and Tests Ordered: Current medicines are reviewed at length with the patient today.  Concerns regarding medicines are outlined above. Medication changes, Labs and Tests ordered today are summarized above and listed in the Patient Instructions accessible in Encounters.   Bonney Lesley Maffucci, PA-C 11/08/2024 12:39 PM     [facility] [place] [address] [place], [place] [address] [phone]      [1]  No outpatient medications have been marked as taking for the 11/10/24 encounter (Appointment) with Maffucci Lesley CROME, PA-C.   "

## 2024-11-10 ENCOUNTER — Ambulatory Visit: Attending: Physician Assistant | Admitting: Physician Assistant

## 2024-11-10 ENCOUNTER — Encounter: Payer: Self-pay | Admitting: Physician Assistant

## 2024-11-10 VITALS — BP 150/80 | HR 58 | Ht 63.5 in | Wt 163.0 lb

## 2024-11-10 DIAGNOSIS — Z87898 Personal history of other specified conditions: Secondary | ICD-10-CM

## 2024-11-10 DIAGNOSIS — I10 Essential (primary) hypertension: Secondary | ICD-10-CM

## 2024-11-10 DIAGNOSIS — I471 Supraventricular tachycardia, unspecified: Secondary | ICD-10-CM | POA: Diagnosis not present

## 2024-11-10 DIAGNOSIS — I480 Paroxysmal atrial fibrillation: Secondary | ICD-10-CM | POA: Diagnosis not present

## 2024-11-10 DIAGNOSIS — E785 Hyperlipidemia, unspecified: Secondary | ICD-10-CM | POA: Diagnosis not present

## 2024-11-10 DIAGNOSIS — I700 Atherosclerosis of aorta: Secondary | ICD-10-CM | POA: Diagnosis not present

## 2024-11-10 DIAGNOSIS — I6522 Occlusion and stenosis of left carotid artery: Secondary | ICD-10-CM

## 2024-11-10 MED ORDER — LISINOPRIL 40 MG PO TABS: 20.0000 mg | ORAL_TABLET | Freq: Two times a day (BID) | ORAL | 3 refills | Status: DC

## 2024-11-10 MED ORDER — AMLODIPINE BESYLATE 5 MG PO TABS: 5.0000 mg | ORAL_TABLET | Freq: Every day | ORAL | 3 refills | Status: AC

## 2024-11-10 NOTE — Patient Instructions (Signed)
 Medication Instructions:   Your physician recommends the following medication changes.  START TAKING: Amlodipine 5 MG  time Daily   Lab Work: No labs ordered today  If you have labs (blood work) drawn today and your tests are completely normal, you will receive your results only by: MyChart Message (if you have MyChart) OR A paper copy in the mail If you have any lab test that is abnormal or we need to change your treatment, we will call you to review the results.  Testing/Procedures: No test ordered today   Follow-Up: At [facility], you and your health needs are our priority.  As part of our continuing mission to provide you with exceptional heart care, our providers are all part of one team.  This team includes your primary Cardiologist (physician) and Advanced Practice Providers or APPs (Physician Assistants and Nurse Practitioners) who all work together to provide you with the care you need, when you need it.  Your next appointment:   Amlodipine 6 month(s)  Provider:   You may see Timothy Gollan, MD or one of the following Advanced Practice Providers on your designated Care Team:   Lonni Meager, NP Lesley Maffucci, PA-C Bernardino Bring, PA-C Cadence Hydaburg, PA-C Tylene Lunch, NP Barnie Hila, NP

## 2024-11-25 ENCOUNTER — Other Ambulatory Visit: Payer: Self-pay | Admitting: Family Medicine

## 2024-11-25 DIAGNOSIS — E559 Vitamin D deficiency, unspecified: Secondary | ICD-10-CM

## 2024-12-09 ENCOUNTER — Other Ambulatory Visit: Payer: Self-pay | Admitting: Physician Assistant

## 2024-12-11 ENCOUNTER — Telehealth: Payer: Self-pay | Admitting: Emergency Medicine

## 2024-12-11 MED ORDER — METOPROLOL SUCCINATE ER 25 MG PO TB24: 12.5000 mg | ORAL_TABLET | Freq: Every day | ORAL | 3 refills | Status: AC

## 2024-12-15 ENCOUNTER — Ambulatory Visit: Admitting: Family Medicine

## 2025-01-03 ENCOUNTER — Ambulatory Visit: Admitting: Family Medicine

## 2025-03-07 ENCOUNTER — Ambulatory Visit: Admitting: Physician Assistant

## 2025-06-19 ENCOUNTER — Ambulatory Visit
